# Patient Record
Sex: Female | Born: 2000 | Hispanic: Yes | Marital: Single | Smoking: Former smoker
Health system: Southern US, Community
[De-identification: ages and names within clinical notes are randomized; demographics above are authoritative.]

## PROBLEM LIST (undated history)

## (undated) DIAGNOSIS — Z789 Other specified health status: Secondary | ICD-10-CM

## (undated) HISTORY — DX: Other specified health status: Z78.9

---
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
## 2021-01-04 ENCOUNTER — Ambulatory Visit
Admission: EM | Admit: 2021-01-04 | Discharge: 2021-01-04 | Disposition: A | Discharge status: Home / Self Care | Payer: Medicaid Other | Attending: Emergency Medicine | Admitting: Emergency Medicine

## 2021-01-04 ENCOUNTER — Other Ambulatory Visit: Payer: Self-pay

## 2021-01-04 DIAGNOSIS — N3000 Acute cystitis without hematuria: Secondary | ICD-10-CM | POA: Insufficient documentation

## 2021-01-04 LAB — URINALYSIS, COMPLETE (UACMP) WITH MICROSCOPIC
Glucose, UA: NEGATIVE mg/dL
Ketones, ur: 15 mg/dL — AB
Nitrite: NEGATIVE
Protein, ur: >300 mg/dL — AB
RBC / HPF: >50 RBC/hpf (ref 0–5)
Specific Gravity, Urine: >1.03 — ABNORMAL HIGH (ref 1.005–1.030)
WBC, UA: >50 WBC/hpf (ref 0–5)
pH: 6.5 (ref 5.0–8.0)

## 2021-01-04 MED ORDER — NITROFURANTOIN MONOHYD MACRO 100 MG PO CAPS: 100.0000 mg | ORAL_CAPSULE | Freq: Two times a day (BID) | ORAL | 0 refills | Status: DC

## 2021-01-04 NOTE — Discharge Instructions (Addendum)
Your urinalysis today was positive for Shunsuke Granzow blood cells as well as bacteria which is indicative of a urinary infection, your urine sample has been sent to the lab to determine exactly which bacteria is growing so that we can make sure you are on an effective treatment, if any changes need to be made to your medication she will be called  Take nitrofurantoin twice a day for the next 5 days  At any point if your symptoms persist or worsen you may follow-up in urgent care for reevaluation  You may attempt methods in the future to prevent urinary infection such as increasing your water intake, practicing good hygiene and urinating after all sexual encounters
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
## 2021-01-04 NOTE — ED Provider Notes (Signed)
[facility]    CSN: [id number] Arrival date & time: 01/04/21  0903      History   Chief Complaint Chief Complaint  Patient presents with   Dysuria    HPI Kendra Chen is a 20 y.o. female.   Patient presents with dysuria, urinary frequency and urgency and vaginal spotting for 3 days.  Has not attempted treatment.  No pertinent medical history.  Denies vaginal discharge, itching, irritation, abdominal pain, flank pain, fever, chills.  History reviewed. No pertinent past medical history.  There are no problems to display for this patient.   History reviewed. No pertinent surgical history.  OB History   No obstetric history on file.      Home Medications    Prior to Admission medications   Medication Sig Start Date End Date Taking? Authorizing Provider  ketorolac (TORADOL) 10 MG tablet Take 1 tablet (10 mg total) by mouth every 6 (six) hours as needed for moderate pain or severe pain. 10/01/19   Coral Spikes, DO  lidocaine (XYLOCAINE) 2 % solution Gargle 15 mL every 3 hours as needed. May swallow if desired. 10/01/19   Coral Spikes, DO    Family History Family History  Problem Relation Age of Onset   Healthy Mother    Healthy Father     Social History Social History   Tobacco Use   Smoking status: Former    Types: Cigarettes    Quit date: 12/21/2020    Years since quitting: 0.0   Smokeless tobacco: Never  Substance Use Topics   Alcohol use: Never     Allergies   Patient has no known allergies.   Review of Systems Review of Systems  Constitutional: Negative.   Gastrointestinal: Negative.   Genitourinary:  Positive for dysuria, frequency and vaginal bleeding. Negative for decreased urine volume, difficulty urinating, dyspareunia, enuresis, flank pain, genital sores, hematuria, menstrual problem, pelvic pain, urgency, vaginal discharge and vaginal pain.  Skin: Negative.     Physical Exam Triage Vital Signs ED Triage Vitals  Enc  Vitals Group     BP 01/04/21 1003 114/84     Pulse Rate 01/04/21 1003 89     Resp 01/04/21 1003 16     Temp 01/04/21 1003 98.2 F (36.8 C)     Temp Source 01/04/21 1003 Oral     SpO2 01/04/21 1003 100 %     Weight --      Height --      Head Circumference --      Peak Flow --      Pain Score 01/04/21 1004 8     Pain Loc --      Pain Edu? --      Excl. in [place]? --    No data found.  Updated Vital Signs BP 114/84 (BP Location: Left Arm)   Pulse 89   Temp 98.2 F (36.8 C) (Oral)   Resp 16   LMP 12/26/2020   SpO2 100%   Visual Acuity Right Eye Distance:   Left Eye Distance:   Bilateral Distance:    Right Eye Near:   Left Eye Near:    Bilateral Near:     Physical Exam Constitutional:      Appearance: Normal appearance. She is normal weight.  HENT:     Head: Normocephalic.  Eyes:     Extraocular Movements: Extraocular movements intact.  Cardiovascular:     Rate and Rhythm: Normal rate and regular rhythm.  Pulses: Normal pulses.     Heart sounds: Normal heart sounds.  Pulmonary:     Effort: Pulmonary effort is normal.     Breath sounds: Normal breath sounds.  Abdominal:     General: Abdomen is flat. Bowel sounds are normal.     Palpations: Abdomen is soft.     Tenderness: There is no abdominal tenderness. There is no right CVA tenderness or left CVA tenderness.  Skin:    General: Skin is warm and dry.  Neurological:     Mental Status: She is alert and oriented to person, place, and time. Mental status is at baseline.  Psychiatric:        Mood and Affect: Mood normal.        Behavior: Behavior normal.     UC Treatments / Results  Labs (all labs ordered are listed, but only abnormal results are displayed) Labs Reviewed  URINALYSIS, COMPLETE (UACMP) WITH MICROSCOPIC - Abnormal; Notable for the following components:      Result Value   APPearance CLOUDY (*)    Specific Gravity, Urine >1.030 (*)    Hgb urine dipstick LARGE (*)    Bilirubin Urine SMALL  (*)    Ketones, ur 15 (*)    Protein, ur >300 (*)    Leukocytes,Ua SMALL (*)    Bacteria, UA MANY (*)    All other components within normal limits  URINE CULTURE    EKG   Radiology No results found.  Procedures Procedures (including critical care time)  Medications Ordered in UC Medications - No data to display  Initial Impression / Assessment and Plan / UC Course  I have reviewed the triage vital signs and the nursing notes.  Pertinent labs & imaging results that were available during my care of the patient were reviewed by me and considered in my medical decision making (see chart for details).  Acute cystitis without hematuria  1.  Urinalysis positive for leukocytes and bacteria, sent for culture 2.  Nitrofurantoin 100 mg twice daily for 5 days 3.  Advised increasing water intake and good vaginal hygiene 4.  Urgent care follow-up as needed for persistent or reoccurring symptoms Final Clinical Impressions(s) / UC Diagnoses   Final diagnoses:  None   Discharge Instructions   None    ED Prescriptions   None    PDMP not reviewed this encounter.   Valinda Hoar, NP 01/04/21 1057

## 2021-01-05 LAB — URINE CULTURE: Culture: <10000 — AB

## 2021-03-12 ENCOUNTER — Emergency Department: Payer: No Typology Code available for payment source

## 2021-03-12 ENCOUNTER — Other Ambulatory Visit: Payer: Self-pay

## 2021-03-12 ENCOUNTER — Encounter: Payer: Self-pay | Admitting: Emergency Medicine

## 2021-03-12 ENCOUNTER — Emergency Department
Admission: EM | Admit: 2021-03-12 | Discharge: 2021-03-12 | Disposition: A | Discharge status: Home / Self Care | Payer: No Typology Code available for payment source | Attending: Emergency Medicine | Admitting: Emergency Medicine

## 2021-03-12 DIAGNOSIS — M25531 Pain in right wrist: Secondary | ICD-10-CM

## 2021-03-12 DIAGNOSIS — Y92410 Unspecified street and highway as the place of occurrence of the external cause: Secondary | ICD-10-CM | POA: Diagnosis not present
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
## 2021-03-12 NOTE — Discharge Instructions (Addendum)
-  Follow-up with the orthopedist listed above if pain fails to improve. -Return to the emergency department anytime if you begin to experience any new or worsening symptoms
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
## 2021-03-24 ENCOUNTER — Ambulatory Visit
Admission: EM | Admit: 2021-03-24 | Discharge: 2021-03-24 | Disposition: A | Discharge status: Home / Self Care | Payer: Medicaid Other

## 2021-03-24 ENCOUNTER — Other Ambulatory Visit: Payer: Self-pay

## 2021-03-24 DIAGNOSIS — M25531 Pain in right wrist: Secondary | ICD-10-CM | POA: Diagnosis not present
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
## 2021-03-24 NOTE — Discharge Instructions (Addendum)
In about 3 to 5 days take your splint off begin to have range of motion to the hand slightly.  If symptoms continue and pain persist call [facility] and set up an orthopedic appointment You will need to follow-up with orthopedic for restrictions for work.  Take Tylenol and Motrin as needed for pain
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
## 2021-04-04 ENCOUNTER — Ambulatory Visit
Admission: EM | Admit: 2021-04-04 | Discharge: 2021-04-04 | Disposition: A | Discharge status: Home / Self Care | Payer: Medicaid Other | Attending: Emergency Medicine | Admitting: Emergency Medicine

## 2021-04-04 ENCOUNTER — Other Ambulatory Visit: Payer: Self-pay

## 2021-04-04 DIAGNOSIS — N390 Urinary tract infection, site not specified: Secondary | ICD-10-CM | POA: Diagnosis not present

## 2021-04-04 LAB — URINALYSIS, ROUTINE W REFLEX MICROSCOPIC
Bilirubin Urine: NEGATIVE
Glucose, UA: NEGATIVE mg/dL
Ketones, ur: NEGATIVE mg/dL
Nitrite: NEGATIVE
Protein, ur: NEGATIVE mg/dL
Specific Gravity, Urine: 1.015 (ref 1.005–1.030)
pH: 7 (ref 5.0–8.0)

## 2021-04-04 LAB — URINALYSIS, MICROSCOPIC (REFLEX): Bacteria, UA: NONE SEEN

## 2021-04-04 MED ORDER — NITROFURANTOIN MONOHYD MACRO 100 MG PO CAPS: 100.0000 mg | ORAL_CAPSULE | Freq: Two times a day (BID) | ORAL | 0 refills | Status: DC

## 2021-04-04 MED ORDER — PHENAZOPYRIDINE HCL 200 MG PO TABS: 200.0000 mg | ORAL_TABLET | Freq: Three times a day (TID) | ORAL | 0 refills | Status: DC

## 2021-04-04 NOTE — ED Provider Notes (Addendum)
[facility]    CSN: [id number] Arrival date & time: 04/04/21  1421      History   Chief Complaint Chief Complaint  Patient presents with   Dysuria    HPI Kendra Chen is a 21 y.o. female.   HPI  21 old female here for evaluation of urinary complaints.  Patient reports that she began having pain with urination as well as urinary frequency yesterday.  She denies any urgency.  She also denies blood in urine, fever, abdominal pain, back pain, vaginal discharge, or vaginal itching.  She is not concerned about possible STIs.  History reviewed. No pertinent past medical history.  There are no problems to display for this patient.   History reviewed. No pertinent surgical history.  OB History   No obstetric history on file.      Home Medications    Prior to Admission medications   Medication Sig Start Date End Date Taking? Authorizing Provider  nitrofurantoin, macrocrystal-monohydrate, (MACROBID) 100 MG capsule Take 1 capsule (100 mg total) by mouth 2 (two) times daily. 04/04/21  Yes Becky Augusta, NP  phenazopyridine (PYRIDIUM) 200 MG tablet Take 1 tablet (200 mg total) by mouth 3 (three) times daily. 04/04/21  Yes Becky Augusta, NP    Family History Family History  Problem Relation Age of Onset   Healthy Mother    Healthy Father     Social History Social History   Tobacco Use   Smoking status: Former    Types: Cigarettes    Quit date: 12/21/2020    Years since quitting: 0.2   Smokeless tobacco: Never  Vaping Use   Vaping Use: Every day  Substance Use Topics   Alcohol use: Never     Allergies   Patient has no known allergies.   Review of Systems Review of Systems  Constitutional:  Negative for fever.  Gastrointestinal:  Negative for abdominal pain.  Genitourinary:  Positive for dysuria and frequency. Negative for urgency, vaginal discharge and vaginal pain.  Musculoskeletal:  Negative for back pain.  Hematological: Negative.      Physical Exam Triage Vital Signs ED Triage Vitals  Enc Vitals Group     BP 04/04/21 1431 114/85     Pulse Rate 04/04/21 1431 85     Resp 04/04/21 1431 16     Temp 04/04/21 1431 98.4 F (36.9 C)     Temp Source 04/04/21 1431 Oral     SpO2 04/04/21 1431 98 %     Weight 04/04/21 1431 110 lb (49.9 kg)     Height 04/04/21 1431 5\' 2"  (1.575 m)     Head Circumference --      Peak Flow --      Pain Score 04/04/21 1434 0     Pain Loc --      Pain Edu? --      Excl. in GC? --    No data found.  Updated Vital Signs BP 114/85 (BP Location: Left Arm)    Pulse 85    Temp 98.4 F (36.9 C) (Oral)    Resp 16    Ht 5\' 2"  (1.575 m)    Wt 110 lb (49.9 kg)    LMP 03/24/2021    SpO2 98%    BMI 20.12 kg/m   Visual Acuity Right Eye Distance:   Left Eye Distance:   Bilateral Distance:    Right Eye Near:   Left Eye Near:    Bilateral Near:     Physical Exam  Vitals and nursing note reviewed.  Constitutional:      Appearance: Normal appearance. She is not ill-appearing.  HENT:     Head: Normocephalic and atraumatic.  Cardiovascular:     Rate and Rhythm: Normal rate and regular rhythm.     Pulses: Normal pulses.     Heart sounds: Normal heart sounds. No murmur heard.   No friction rub. No gallop.  Pulmonary:     Effort: Pulmonary effort is normal.     Breath sounds: Normal breath sounds. No wheezing, rhonchi or rales.  Abdominal:     General: Abdomen is flat.     Palpations: Abdomen is soft.     Tenderness: There is no abdominal tenderness. There is no right CVA tenderness, left CVA tenderness, guarding or rebound.  Skin:    General: Skin is warm and dry.     Capillary Refill: Capillary refill takes less than 2 seconds.     Findings: No erythema or rash.  Neurological:     General: No focal deficit present.     Mental Status: She is alert and oriented to person, place, and time.  Psychiatric:        Mood and Affect: Mood normal.        Behavior: Behavior normal.         Thought Content: Thought content normal.        Judgment: Judgment normal.     UC Treatments / Results  Labs (all labs ordered are listed, but only abnormal results are displayed) Labs Reviewed  URINALYSIS, ROUTINE W REFLEX MICROSCOPIC - Abnormal; Notable for the following components:      Result Value   Hgb urine dipstick TRACE (*)    Leukocytes,Ua SMALL (*)    All other components within normal limits  URINE CULTURE  URINALYSIS, MICROSCOPIC (REFLEX)    EKG   Radiology No results found.  Procedures Procedures (including critical care time)  Medications Ordered in UC Medications - No data to display  Initial Impression / Assessment and Plan / UC Course  I have reviewed the triage vital signs and the nursing notes.  Pertinent labs & imaging results that were available during my care of the patient were reviewed by me and considered in my medical decision making (see chart for details).  Patient is a nontoxic-appearing 21 year old female here for evaluation of painful urination and urinary frequency that started yesterday.  There are no other associated symptoms.  Physical exam reveals benign cardiopulmonary exam clear lung sounds in all fields.  No CVA tenderness on exam.  Abdomen is flat, soft, and nontender.  Urinalysis was collected at triage and is pending.  Urinalysis shows trace hemoglobin and small leukocytes.  Micro ReFlex 11-20 WBCs.  We will send urine for culture.  We will discharge patient home on Macrobid twice daily for 5 days for treatment of UTI as well as Pyridium to help with urinary discomfort.  I have instructed her to increase oral fluid intake so she increases urine production and flexes her urinary tract.  Return precautions reviewed with patient.   Final Clinical Impressions(s) / UC Diagnoses   Final diagnoses:  Lower urinary tract infectious disease     Discharge Instructions      Take the Macrobid twice daily for 5 days with food for  treatment of urinary tract infection.  Use the Pyridium every 8 hours as needed for urinary discomfort.  This will turn your urine a bright red-orange.  Increase your oral fluid intake so  that you increase your urine production and or flushing your urinary system.  Take an over-the-counter probiotic, such as Culturelle-Align-Activia, 1 hour after each dose of antibiotic to prevent diarrhea or yeast infections from forming.  We will culture urine and change the antibiotics if necessary.  Return for reevaluation, or see your primary care provider, for any new or worsening symptoms.      ED Prescriptions     Medication Sig Dispense Auth. Provider   nitrofurantoin, macrocrystal-monohydrate, (MACROBID) 100 MG capsule Take 1 capsule (100 mg total) by mouth 2 (two) times daily. 10 capsule Becky Augusta, NP   phenazopyridine (PYRIDIUM) 200 MG tablet Take 1 tablet (200 mg total) by mouth 3 (three) times daily. 6 tablet Becky Augusta, NP      PDMP not reviewed this encounter.   Becky Augusta, NP 04/04/21 1459    Becky Augusta, NP 04/04/21 1459

## 2021-04-04 NOTE — Discharge Instructions (Addendum)

## 2021-04-04 NOTE — ED Triage Notes (Signed)
Pt here with C/O urine burning and frequency since yesterday. Denies discharge or STD.

## 2021-04-06 LAB — URINE CULTURE: Culture: 30000 — AB

## 2021-04-15 ENCOUNTER — Other Ambulatory Visit: Payer: Self-pay

## 2021-04-15 ENCOUNTER — Encounter: Payer: Self-pay | Admitting: Emergency Medicine

## 2021-04-15 ENCOUNTER — Ambulatory Visit
Admission: EM | Admit: 2021-04-15 | Discharge: 2021-04-15 | Disposition: A | Discharge status: Home / Self Care | Payer: Medicaid Other

## 2021-04-15 DIAGNOSIS — L299 Pruritus, unspecified: Secondary | ICD-10-CM | POA: Diagnosis not present

## 2021-04-15 DIAGNOSIS — L509 Urticaria, unspecified: Secondary | ICD-10-CM

## 2021-04-15 NOTE — Discharge Instructions (Addendum)
-  Your hives and itching are likely due to the new laundry detergent.  I would not use that anymore and rewash any of her close and sheets that you had washed again with the old detergent ACS. ?- Start taking nondrowsy Claritin during the day and 2 Benadryl at night or 1-2 Benadryl every 6 hours as needed for itching.  You can use hydrocortisone cream on the areas that are most itchy. ?- If this is not improving and resolving in the next few days, make a follow-up appoint with your PCP as you may need allergy testing. ?

## 2021-04-15 NOTE — ED Provider Notes (Signed)
?[facility] ? ? ? ?CSN: [id number] ?Arrival date & time: 04/15/21  1140 ? ? ?  ? ?History   ?Chief Complaint ?Chief Complaint  ?Patient presents with  ? Pruritis  ? ? ?HPI ?Kendra Chen is a 21 y.o. female presenting for 2 day history of red, itchy rash of torso.  Reports her entire body itches though.  She states she started using a new detergent few days ago.  She states she washed her bed sheets with a new detergent and when she woke up in the morning she started itching everywhere.  Patient also reports that she cooked a meal with coconut oil and is not cooked with coconut oil before.  She says she has had coconut water and other coconut things without any reactions.  She says she actually does not know of any allergies that she has at all.  Patient denies any facial swelling or breathing trouble.  She has not treated condition in any way.  She says she did rewash her sheets and has not been wearing any close but she had washed and new detergent.  She has no other concerns. ? ?HPI ? ?History reviewed. No pertinent past medical history. ? ?There are no problems to display for this patient. ? ? ?History reviewed. No pertinent surgical history. ? ?OB History   ?No obstetric history on file. ?  ? ? ? ?Home Medications   ? ?Prior to Admission medications   ?Not on File  ? ? ?Family History ?Family History  ?Problem Relation Age of Onset  ? Healthy Mother   ? Healthy Father   ? ? ?Social History ?Social History  ? ?Tobacco Use  ? Smoking status: Former  ?  Types: Cigarettes  ?  Quit date: 12/21/2020  ?  Years since quitting: 0.3  ? Smokeless tobacco: Never  ?Vaping Use  ? Vaping Use: Former  ?Substance Use Topics  ? Alcohol use: Never  ? Drug use: Not Currently  ? ? ? ?Allergies   ?Patient has no known allergies. ? ? ?Review of Systems ?Review of Systems  ?Constitutional:  Negative for fatigue and fever.  ?HENT:  Negative for facial swelling and trouble swallowing.   ?Respiratory:  Negative for chest  tightness, shortness of breath and wheezing.   ?Gastrointestinal:  Negative for vomiting.  ?Skin:  Positive for rash.  ?Neurological:  Negative for weakness and numbness.  ? ? ?Physical Exam ?Triage Vital Signs ?ED Triage Vitals  ?Enc Vitals Group  ?   BP 04/15/21 1208 108/72  ?   Pulse Rate 04/15/21 1208 67  ?   Resp 04/15/21 1208 18  ?   Temp 04/15/21 1208 98.7 ?F (37.1 ?C)  ?   Temp Source 04/15/21 1208 Oral  ?   SpO2 04/15/21 1208 99 %  ?   Weight 04/15/21 1207 110 lb 0.2 oz (49.9 kg)  ?   Height 04/15/21 1207 5\' 2"  (1.575 m)  ?   Head Circumference --   ?   Peak Flow --   ?   Pain Score 04/15/21 1206 0  ?   Pain Loc --   ?   Pain Edu? --   ?   Excl. in GC? --   ? ?No data found. ? ?Updated Vital Signs ?BP 108/72 (BP Location: Right Arm)   Pulse 67   Temp 98.7 ?F (37.1 ?C) (Oral)   Resp 18   Ht 5\' 2"  (1.575 m)   Wt 110 lb 0.2 oz (49.9  kg)   LMP 03/24/2021 (Approximate)   SpO2 99%   BMI 20.12 kg/m?  ?   ? ?Physical Exam ?Vitals and nursing note reviewed.  ?Constitutional:   ?   General: She is not in acute distress. ?   Appearance: Normal appearance. She is not ill-appearing or toxic-appearing.  ?HENT:  ?   Head: Normocephalic and atraumatic.  ?   Nose: Nose normal.  ?   Mouth/Throat:  ?   Mouth: Mucous membranes are moist.  ?   Pharynx: Oropharynx is clear.  ?Eyes:  ?   General: No scleral icterus.    ?   Right eye: No discharge.     ?   Left eye: No discharge.  ?   Conjunctiva/sclera: Conjunctivae normal.  ?Cardiovascular:  ?   Rate and Rhythm: Normal rate and regular rhythm.  ?   Heart sounds: Normal heart sounds.  ?Pulmonary:  ?   Effort: Pulmonary effort is normal. No respiratory distress.  ?   Breath sounds: Normal breath sounds.  ?Musculoskeletal:  ?   Cervical back: Neck supple.  ?Skin: ?   General: Skin is dry.  ?   Findings: Rash (few minor areas of erythematous wheals abdomen and back) present.  ?Neurological:  ?   General: No focal deficit present.  ?   Mental Status: She is alert. Mental  status is at baseline.  ?   Motor: No weakness.  ?   Gait: Gait normal.  ?Psychiatric:     ?   Mood and Affect: Mood normal.     ?   Behavior: Behavior normal.     ?   Thought Content: Thought content normal.  ? ? ? ?UC Treatments / Results  ?Labs ?(all labs ordered are listed, but only abnormal results are displayed) ?Labs Reviewed - No data to display ? ?EKG ? ? ?Radiology ?No results found. ? ?Procedures ?Procedures (including critical care time) ? ?Medications Ordered in UC ?Medications - No data to display ? ?Initial Impression / Assessment and Plan / UC Course  ?I have reviewed the triage vital signs and the nursing notes. ? ?Pertinent labs & imaging results that were available during my care of the patient were reviewed by me and considered in my medical decision making (see chart for details). ? ?21 year old female presenting for urticarial pruritic rash of abdomen.  Onset was immediately after she started washing with a new detergent.  Because reports the use of coconut oil.  Patient has a very minor rash of his abdomen and back.  Has not taken any antihistamines.  Advised patient symptoms most likely due to changing the detergent and go back to the alone.  Reviewed starting antihistamines and using topical hydrocortisone cream as needed for itching.  Reviewed if no improvement after making these changes may need to follow-up PCP for allergy testing.  ED precautions reviewed. ? ? ?Final Clinical Impressions(s) / UC Diagnoses  ? ?Final diagnoses:  ?Hives  ?Pruritus  ? ? ? ?Discharge Instructions   ? ?  ?-Your hives and itching are likely due to the new laundry detergent.  I would not use that anymore and rewash any of her close and sheets that you had washed again with the old detergent ACS. ?- Start taking nondrowsy Claritin during the day and 2 Benadryl at night or 1-2 Benadryl every 6 hours as needed for itching.  You can use hydrocortisone cream on the areas that are most itchy. ?- If this is not  improving and  resolving in the next few days, make a follow-up appoint with your PCP as you may need allergy testing. ? ? ? ? ?ED Prescriptions   ?None ?  ? ?PDMP not reviewed this encounter. ?  ?Shirlee Latch, PA-C ?04/15/21 1310 ? ?

## 2021-04-15 NOTE — ED Triage Notes (Signed)
Pt c/o itching over her entire body. Started about 2 days ago. No visible rash, she does have marks on her skin from scratching with her finger nails. She states she changes her fabric softener recently and believes that it what is causing the itching. She states she has also started cooking with coconut oil. No changes in medications, or soaps. ?

## 2021-07-25 ENCOUNTER — Other Ambulatory Visit: Payer: Self-pay

## 2021-07-25 ENCOUNTER — Emergency Department
Admission: EM | Admit: 2021-07-25 | Discharge: 2021-07-25 | Disposition: A | Discharge status: Home / Self Care | Payer: Medicaid Other | Attending: Emergency Medicine | Admitting: Emergency Medicine

## 2021-07-25 ENCOUNTER — Emergency Department: Payer: Medicaid Other

## 2021-07-25 DIAGNOSIS — N9489 Other specified conditions associated with female genital organs and menstrual cycle: Secondary | ICD-10-CM | POA: Insufficient documentation

## 2021-07-25 DIAGNOSIS — N898 Other specified noninflammatory disorders of vagina: Secondary | ICD-10-CM | POA: Insufficient documentation

## 2021-07-25 DIAGNOSIS — Z3492 Encounter for supervision of normal pregnancy, unspecified, second trimester: Secondary | ICD-10-CM

## 2021-07-25 DIAGNOSIS — O26892 Other specified pregnancy related conditions, second trimester: Secondary | ICD-10-CM

## 2021-07-25 DIAGNOSIS — O26852 Spotting complicating pregnancy, second trimester: Secondary | ICD-10-CM | POA: Diagnosis not present

## 2021-07-25 DIAGNOSIS — Z3A17 17 weeks gestation of pregnancy: Secondary | ICD-10-CM | POA: Diagnosis not present

## 2021-07-25 DIAGNOSIS — R55 Syncope and collapse: Secondary | ICD-10-CM | POA: Diagnosis not present

## 2021-07-25 LAB — WET PREP, GENITAL
Clue Cells Wet Prep HPF POC: NONE SEEN
Sperm: NONE SEEN
Trich, Wet Prep: NONE SEEN
WBC, Wet Prep HPF POC: >=10 — AB (ref <10)
Yeast Wet Prep HPF POC: NONE SEEN

## 2021-07-25 LAB — URINALYSIS, ROUTINE W REFLEX MICROSCOPIC
Bacteria, UA: NONE SEEN
Bilirubin Urine: NEGATIVE
Glucose, UA: NEGATIVE mg/dL
Hgb urine dipstick: NEGATIVE
Ketones, ur: NEGATIVE mg/dL
Nitrite: NEGATIVE
Protein, ur: NEGATIVE mg/dL
Specific Gravity, Urine: 1.02 (ref 1.005–1.030)
pH: 7 (ref 5.0–8.0)

## 2021-07-25 LAB — BASIC METABOLIC PANEL
Anion gap: 6 (ref 5–15)
BUN: 11 mg/dL (ref 6–20)
CO2: 24 mmol/L (ref 22–32)
Calcium: 9.1 mg/dL (ref 8.9–10.3)
Chloride: 106 mmol/L (ref 98–111)
Creatinine, Ser: 0.31 mg/dL — ABNORMAL LOW (ref 0.44–1.00)
GFR, Estimated: >60 mL/min (ref >60)
Glucose, Bld: 82 mg/dL (ref 70–99)
Potassium: 3.9 mmol/L (ref 3.5–5.1)
Sodium: 136 mmol/L (ref 135–145)

## 2021-07-25 LAB — CBC WITH DIFFERENTIAL/PLATELET
Abs Immature Granulocytes: 0.04 10*3/uL (ref 0.00–0.07)
Basophils Absolute: 0 10*3/uL (ref 0.0–0.1)
Basophils Relative: 0 %
Eosinophils Absolute: 0 10*3/uL (ref 0.0–0.5)
Eosinophils Relative: 0 %
HCT: 34 % — ABNORMAL LOW (ref 36.0–46.0)
Hemoglobin: 11.2 g/dL — ABNORMAL LOW (ref 12.0–15.0)
Immature Granulocytes: 1 %
Lymphocytes Relative: 23 %
Lymphs Abs: 1.9 10*3/uL (ref 0.7–4.0)
MCH: 28.3 pg (ref 26.0–34.0)
MCHC: 32.9 g/dL (ref 30.0–36.0)
MCV: 85.9 fL (ref 80.0–100.0)
Monocytes Absolute: 0.4 10*3/uL (ref 0.1–1.0)
Monocytes Relative: 5 %
Neutro Abs: 5.9 10*3/uL (ref 1.7–7.7)
Neutrophils Relative %: 71 %
Platelets: 150 10*3/uL (ref 150–400)
RBC: 3.96 MIL/uL (ref 3.87–5.11)
RDW: 12.8 % (ref 11.5–15.5)
WBC: 8.3 10*3/uL (ref 4.0–10.5)
nRBC: 0 % (ref 0.0–0.2)

## 2021-07-25 LAB — POC URINE PREG, ED: Preg Test, Ur: POSITIVE — AB

## 2021-07-25 LAB — CHLAMYDIA/NGC RT PCR (ARMC ONLY)
Chlamydia Tr: NOT DETECTED
N gonorrhoeae: NOT DETECTED

## 2021-07-25 LAB — HCG, QUANTITATIVE, PREGNANCY: hCG, Beta Chain, Quant, S: 27438 m[IU]/mL — ABNORMAL HIGH (ref <5)

## 2021-07-25 NOTE — ED Provider Triage Note (Signed)
Emergency Medicine Provider Triage Evaluation Note  [facility] , a 21 y.o. female  was evaluated in triage.  Pt complains of vaginal discharge and pelvic cramping. She is [redacted] weeks pregnant. No vaginal bleeding.    Physical Exam  BP 120/79 (BP Location: Left Arm)   Pulse 87   Temp 98.5 F (36.9 C) (Oral)   Resp 18   SpO2 96%  Gen:   Awake, no distress   Resp:  Normal effort  MSK:   Moves extremities without difficulty  Other:    Medical Decision Making  Medically screening exam initiated at 6:00 PM.  Appropriate orders placed.  Kendra Chen was informed that the remainder of the evaluation will be completed by another provider, this initial triage assessment does not replace that evaluation, and the importance of remaining in the ED until their evaluation is complete.    Chinita Pester, FNP 07/25/21 2242

## 2021-07-25 NOTE — ED Provider Notes (Addendum)
[facility] Provider Note    Event Date/Time   First MD Initiated Contact with Patient 07/25/21 1825     (approximate)   History   Vaginal Discharge   HPI  Kendra Chen is a 21 y.o. female presents to the emergency department for treatment and evaluation of vaginal discharge and pelvic cramping.  No bleeding.  She is approximately [redacted] weeks pregnant.  Gravida 1.  She has had her initial prenatal visit with [facility] department.  History reviewed. No pertinent past medical history.   Physical Exam   Triage Vital Signs: ED Triage Vitals  Enc Vitals Group     BP 07/25/21 1759 120/79     Pulse Rate 07/25/21 1759 87     Resp 07/25/21 1759 18     Temp 07/25/21 1759 98.5 F (36.9 C)     Temp Source 07/25/21 1759 Oral     SpO2 07/25/21 1759 96 %     Weight 07/25/21 1800 110 lb (49.9 kg)     Height 07/25/21 1800 5\' 2"  (1.575 m)     Head Circumference --      Peak Flow --      Pain Score 07/25/21 1759 3     Pain Loc --      Pain Edu? --      Excl. in GC? --     Most recent vital signs: Vitals:   07/25/21 1759  BP: 120/79  Pulse: 87  Resp: 18  Temp: 98.5 F (36.9 C)  SpO2: 96%    General: Awake, no distress.  CV:  Good peripheral perfusion.  Resp:  Normal effort.  Abd:  No distention.  Other:  Cervix is closed.  No blood in the vaginal vault.  Thick yellowish discharge on cervix.   ED Results / Procedures / Treatments   Labs (all labs ordered are listed, but only abnormal results are displayed) Labs Reviewed  WET PREP, GENITAL - Abnormal; Notable for the following components:      Result Value   WBC, Wet Prep HPF POC >=10 (*)    All other components within normal limits  URINALYSIS, ROUTINE W REFLEX MICROSCOPIC - Abnormal; Notable for the following components:   Color, Urine YELLOW (*)    APPearance CLEAR (*)    Leukocytes,Ua TRACE (*)    All other components within normal limits  HCG, QUANTITATIVE, PREGNANCY -  Abnormal; Notable for the following components:   hCG, Beta Chain, Quant, S M (*)    All other components within normal limits  CBC WITH DIFFERENTIAL/PLATELET - Abnormal; Notable for the following components:   Hemoglobin 11.2 (*)    HCT 34.0 (*)    All other components within normal limits  BASIC METABOLIC PANEL - Abnormal; Notable for the following components:   Creatinine, Ser 0.31 (*)    All other components within normal limits  POC URINE PREG, ED - Abnormal; Notable for the following components:   Preg Test, Ur POSITIVE (*)    All other components within normal limits  CHLAMYDIA/NGC RT PCR ([facility] ONLY)               EKG     RADIOLOGY  Single IUP fetal heart rate 147  I have independently reviewed and interpreted imaging as well as reviewed report from radiology.  PROCEDURES:  Critical Care performed: No  Procedures  Pelvic exam and specimen collection.  MEDICATIONS ORDERED IN ED:  Medications - No data to display  IMPRESSION / MDM / ASSESSMENT AND PLAN / ED COURSE   I reviewed the triage vital signs and the nursing notes.  Differential diagnosis includes, but is not limited to: Vaginal discharge, chlamydia, gonorrhea, trichomoniasis.  Patient's presentation is most consistent with acute presentation with potential threat to life or bodily function.  21 year old female presenting to the emergency department for treatment and evaluation of pelvic cramping and vaginal discharge in the setting of second trimester pregnancy.  See HPI for further details.  Pelvic exam completed and specimens obtained.  Awaiting results of labs and ultrasound. Clinical Course as of 07/25/21 2132  Wynelle Link Jul 25, 2021  1925 Patient now reporting that she has had 2 syncopal episodes this week. One being Monday while at work. Someone caught her before she hit her head. And the second was Wednesday, but she felt like she was going to pass out and sat down to prevent injury. She  reported these events to her clinic who felt it was likely glucose or iron level related. They also scheduled her for an EKG this week. She states that she has passed out a few other times in the past, but has never been evaluated. No dizziness or symptoms at present.  [CT]  2048 EKG is normal. Labs are reassuring. Wet  prep specimen shows WBC. GC and chlamydia are pending. US shows single IUP 17 weeks 3 days with FHR of 147. [CT]  2131 Chlamydia and gonorrhea testing is negative.  Patient will be discharged home and encouraged to keep her follow-up appointments with the clinic.  She was encouraged to return to the emergency department for any symptom changes or worsens or she is unable to get an appointment right away. [CT]    Clinical Course User Index [CT] Terena Bohan B, FNP     FINAL CLINICAL IMPRESSION(S) / ED DIAGNOSES   Final diagnoses:  Vaginal discharge  Second trimester pregnancy  Syncope, unspecified syncope type     Rx / DC Orders   ED Discharge Orders     None        Note:  This document was prepared using Dragon voice recognition software and may include unintentional dictation errors.   Chinita Pester, FNP 07/25/21 2132    Chinita Pester, FNP 07/25/21 2149    Merwyn Katos, MD 07/25/21 2320

## 2021-07-25 NOTE — ED Notes (Signed)
Pt presents to ED with c/o of vaginal discharge. Pt states the discharge has been going on for about 1 week. Pt describes discharge as think and white. Denies any odor. Pt also states she had an syncopal episode on Monday as well. Pt states she was out for less than a minute. Pt denies hitting her head.

## 2021-07-25 NOTE — Discharge Instructions (Signed)
Your STD testing is negative today.  Your labs do not show any concerning findings.  Your EKG is normal.  Please keep your follow-up appointment as scheduled.  Return to the emergency department for symptoms of change or worsen if you are unable to schedule an appointment.

## 2021-07-25 NOTE — ED Triage Notes (Signed)
Pt states for the past week she has had yellowish green discharge and cramping- pt is 17 weeks preg with her first pregnancy- pt states she gets her prenatal care in [place]

## 2021-07-25 NOTE — ED Notes (Signed)
dc ppw provided to patient. Followup and rx information given. pt declines vs at this time and provides verbal consent for dc at this time. 

## 2021-07-25 NOTE — ED Notes (Signed)
Pt urine sent to lab.

## 2021-08-10 ENCOUNTER — Ambulatory Visit
Admission: EM | Admit: 2021-08-10 | Discharge: 2021-08-10 | Disposition: A | Discharge status: Home / Self Care | Payer: Medicaid Other | Attending: Physician Assistant | Admitting: Physician Assistant

## 2021-08-10 DIAGNOSIS — Z3A20 20 weeks gestation of pregnancy: Secondary | ICD-10-CM | POA: Insufficient documentation

## 2021-08-10 DIAGNOSIS — O2340 Unspecified infection of urinary tract in pregnancy, unspecified trimester: Secondary | ICD-10-CM | POA: Diagnosis not present

## 2021-08-10 DIAGNOSIS — O219 Vomiting of pregnancy, unspecified: Secondary | ICD-10-CM | POA: Diagnosis not present

## 2021-08-10 DIAGNOSIS — R197 Diarrhea, unspecified: Secondary | ICD-10-CM | POA: Insufficient documentation

## 2021-08-10 DIAGNOSIS — R112 Nausea with vomiting, unspecified: Secondary | ICD-10-CM | POA: Diagnosis present

## 2021-08-10 DIAGNOSIS — O99612 Diseases of the digestive system complicating pregnancy, second trimester: Secondary | ICD-10-CM | POA: Diagnosis not present

## 2021-08-10 DIAGNOSIS — R8290 Unspecified abnormal findings in urine: Secondary | ICD-10-CM | POA: Diagnosis present

## 2021-08-10 LAB — URINALYSIS, ROUTINE W REFLEX MICROSCOPIC
Bilirubin Urine: NEGATIVE
Glucose, UA: NEGATIVE mg/dL
Hgb urine dipstick: NEGATIVE
Ketones, ur: >160 mg/dL — AB
Nitrite: NEGATIVE
Protein, ur: NEGATIVE mg/dL
Specific Gravity, Urine: 1.015 (ref 1.005–1.030)
pH: 7.5 (ref 5.0–8.0)

## 2021-08-10 LAB — URINALYSIS, MICROSCOPIC (REFLEX)

## 2021-08-10 MED ORDER — ONDANSETRON 4 MG PO TBDP: 4.0000 mg | ORAL_TABLET | Freq: Four times a day (QID) | ORAL | 0 refills | Status: AC | PRN

## 2021-08-10 MED ORDER — ONDANSETRON 4 MG PO TBDP
4.0000 mg | ORAL_TABLET | Freq: Once | ORAL | Status: AC
  Administered 2021-08-10: 4 mg via ORAL

## 2021-08-10 NOTE — Discharge Instructions (Addendum)
-  You most likely have a stomach virus or food poisoning.  In both cases people generally feel better within 24 to 48 hours. - I sent something for your nausea. - Make sure to replenish your electrolytes.  I would suggest Pedialyte and continue with a bland diet. - I am culturing the urine since there were a few white blood cells in there.  We will call you in a couple days if you need antibiotics for a UTI.  If you start to become symptomatic before then, please call us and we may send something for you sooner. - If your abdominal pain worsens, you develop a fever greater than 100.4 degrees or you have worsening abdominal or back pain you should go to the emergency department for immediate evaluation.  If you notice you have any vaginal bleeding you should call 911 or go immediately to the ER.  ABDOMINAL PAIN: You may take Tylenol for pain relief. Use medications as directed including antiemetics and antidiarrheal medications if suggested or prescribed. You should increase fluids and electrolytes as well as rest over these next several days. If you have any questions or concerns, or if your symptoms are not improving or if especially if they acutely worsen, please call or stop back to the clinic immediately and we will be happy to help you or go to the ER   ABDOMINAL PAIN RED FLAGS: Seek immediate further care if: symptoms remain the same or worsen over the next 3-7 days, you are unable to keep fluids down, you see blood or mucus in your stool, you vomit black or dark red material, you have a fever of 101.F or higher, you have localized and/or persistent abdominal pain

## 2021-08-10 NOTE — ED Triage Notes (Signed)
Pt c/o diarrhea and vomiting. Pt ate spicy food and cookout on 08/09/21.  Pt ate Mayonnaise and cheese that has been left out of the fridge.  Pt is having stomach pain, back aches, and a headache.  Pt states that she has been vomiting since 3am.   Pt is 20w Pregnant.   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

## 2021-08-10 NOTE — ED Provider Notes (Signed)
[facility]    CSN: [id number] Arrival date & time: 08/10/21  1625      History   Chief Complaint Chief Complaint  Patient presents with   Abdominal Pain    HPI Kendra Chen is a 21 y.o. female presenting for onset of nausea/vomiting and diarrhea last night.  Patient reports she ate 5 corn on the cob's and had fast food for breakfast.  She also reports that she ate a lot of fast food from cookout last night.  Patient reports that she had a large amount of food last night and within an hour of having all this.  She started to have nausea and vomiting with diarrhea.  Reports today she has had 5 episodes of vomiting and about 5 episodes of watery diarrhea.  She reports that her vomit is greenish in coloration and her stool is watery.  Denies any associated blood or black stools.  She does report lower back ache and abdominal cramping.  He says her back pain is worse than her abdominal pain.  She reports that she gets abdominal cramping before she needs to have a BM.  Reports mild headaches as well.  She has not had a fever.  Has been fatigued.  States that she went to work today and did not feel well and had multiple episodes of vomiting and diarrhea at work.  Patient is [redacted] weeks pregnant and says everything has been going well with her pregnancy.  She denies any vaginal bleeding, dysuria or vaginal discharge.  Reports she had a UTI couple of months ago but it cleared up and she is no longer having UTI symptoms.  She denies any sick contacts presently.  Has not had any recent travel and no recent antibiotics in the past 1 month.  Has not been taking any OTC meds for symptoms.  No other complaints.  HPI  History reviewed. No pertinent past medical history.  There are no problems to display for this patient.   History reviewed. No pertinent surgical history.  OB History     Gravida  1   Para      Term      Preterm      AB      Living         SAB      IAB       Ectopic      Multiple      Live Births               Home Medications    Prior to Admission medications   Medication Sig Start Date End Date Taking? Authorizing Provider  ondansetron (ZOFRAN-ODT) 4 MG disintegrating tablet Take 1 tablet (4 mg total) by mouth every 6 (six) hours as needed for up to 3 days for nausea or vomiting. 08/10/21 08/13/21 Yes Shirlee Latch, PA-C    Family History Family History  Problem Relation Age of Onset   Healthy Mother    Healthy Father     Social History Social History   Tobacco Use   Smoking status: Former    Types: Cigarettes    Quit date: 12/21/2020    Years since quitting: 0.6   Smokeless tobacco: Never  Vaping Use   Vaping Use: Former  Substance Use Topics   Alcohol use: Never   Drug use: Not Currently     Allergies   Patient has no known allergies.   Review of Systems Review of Systems  Constitutional:  Positive  for appetite change. Negative for fatigue and fever.  HENT:  Negative for congestion.   Respiratory:  Negative for cough and shortness of breath.   Cardiovascular:  Negative for chest pain.  Gastrointestinal:  Positive for abdominal pain, diarrhea, nausea and vomiting. Negative for blood in stool.  Genitourinary:  Negative for dysuria, flank pain, hematuria, vaginal bleeding and vaginal discharge.  Musculoskeletal:  Positive for back pain.  Neurological:  Positive for headaches. Negative for weakness.     Physical Exam Triage Vital Signs ED Triage Vitals  Enc Vitals Group     BP 08/10/21 1719 122/82     Pulse Rate 08/10/21 1719 (!) 113     Resp 08/10/21 1719 18     Temp 08/10/21 1719 99.6 F (37.6 C)     Temp Source 08/10/21 1719 Oral     SpO2 08/10/21 1719 97 %     Weight 08/10/21 1718 110 lb (49.9 kg)     Height 08/10/21 1718 5\' 3"  (1.6 m)     Head Circumference --      Peak Flow --      Pain Score 08/10/21 1717 7     Pain Loc --      Pain Edu? --      Excl. in GC? --    No data  found.  Updated Vital Signs BP 122/82 (BP Location: Left Arm)   Pulse (!) 113   Temp 99.6 F (37.6 C) (Oral)   Resp 18   Ht 5\' 3"  (1.6 m)   Wt 110 lb (49.9 kg)   LMP 03/24/2021 (Approximate)   SpO2 97%   BMI 19.49 kg/m       Physical Exam Vitals and nursing note reviewed.  Constitutional:      General: She is not in acute distress.    Appearance: Normal appearance. She is ill-appearing. She is not toxic-appearing.  HENT:     Head: Normocephalic and atraumatic.     Nose: Nose normal.     Mouth/Throat:     Mouth: Mucous membranes are moist.     Pharynx: Oropharynx is clear.  Eyes:     General: No scleral icterus.       Right eye: No discharge.        Left eye: No discharge.     Conjunctiva/sclera: Conjunctivae normal.  Cardiovascular:     Rate and Rhythm: Regular rhythm. Tachycardia present.     Heart sounds: Normal heart sounds.  Pulmonary:     Effort: Pulmonary effort is normal. No respiratory distress.     Breath sounds: Normal breath sounds.  Abdominal:     Palpations: Abdomen is soft.     Tenderness: There is abdominal tenderness (mild suprapubic). There is no right CVA tenderness or left CVA tenderness.  Musculoskeletal:     Cervical back: Neck supple.  Skin:    General: Skin is dry.  Neurological:     General: No focal deficit present.     Mental Status: She is alert. Mental status is at baseline.     Motor: No weakness.     Gait: Gait normal.  Psychiatric:        Mood and Affect: Mood normal.        Behavior: Behavior normal.        Thought Content: Thought content normal.      UC Treatments / Results  Labs (all labs ordered are listed, but only abnormal results are displayed) Labs Reviewed  URINALYSIS, ROUTINE W REFLEX MICROSCOPIC -  Abnormal; Notable for the following components:      Result Value   APPearance HAZY (*)    Ketones, ur >160 (*)    Leukocytes,Ua TRACE (*)    All other components within normal limits  URINALYSIS, MICROSCOPIC  (REFLEX) - Abnormal; Notable for the following components:   Bacteria, UA MANY (*)    All other components within normal limits  URINE CULTURE    EKG   Radiology No results found.  Procedures Procedures (including critical care time)  Medications Ordered in UC Medications  ondansetron (ZOFRAN-ODT) disintegrating tablet 4 mg (4 mg Oral Given 08/10/21 1802)    Initial Impression / Assessment and Plan / UC Course  I have reviewed the triage vital signs and the nursing notes.  Pertinent labs & imaging results that were available during my care of the patient were reviewed by me and considered in my medical decision making (see chart for details).  21 year old female who is [redacted] weeks pregnant presents for nausea, vomiting and diarrhea since last night.  Also has had abdominal cramping and backache.  Patient reports symptom onset about an hour after she ate a large amount of cookout fast food.  She also reports eating 5 corn on the cob and having fast food earlier in the day as well.  She denies any sick contacts.  No recent travel.  Patient is mildly tachycardic but expect this may be tachycardia related to pregnancy.  She is afebrile.  She is mildly ill-appearing but nontoxic.  Abdomen is soft and she has mild suprapubic tenderness.  No CVA tenderness.  Chest clear to auscultation.  Patient given 4 mg ODT Zofran in clinic  UA today shows trace leukocytes.  We will send urine for culture and treat for UTI if positive for bacteria on culture.  Advised patient symptoms and presentation most likely related to viral gastroenteritis versus food poisoning.  Advised symptoms should get better within 24 to 48 hours.  Sent Zofran to pharmacy.  Encouraged her to increase rest and fluid intake.  Advised Pedialyte.  Advise close monitoring and if she develops a fever, has black or bloody stool or localized/worsening abdominal or back pain or if she has vaginal bleeding she needs to go immediately to the  emergency department.  Patient agrees with plan.  Work note given.    Final Clinical Impressions(s) / UC Diagnoses   Final diagnoses:  Nausea vomiting and diarrhea  Abnormal urinalysis     Discharge Instructions      -You most likely have a stomach virus or food poisoning.  In both cases people generally feel better within 24 to 48 hours. - I sent something for your nausea. - Make sure to replenish your electrolytes.  I would suggest Pedialyte and continue with a bland diet. - I am culturing the urine since there were a few white blood cells in there.  We will call you in a couple days if you need antibiotics for a UTI.  If you start to become symptomatic before then, please call us and we may send something for you sooner. - If your abdominal pain worsens, you develop a fever greater than 100.4 degrees or you have worsening abdominal or back pain you should go to the emergency department for immediate evaluation.  If you notice you have any vaginal bleeding you should call 911 or go immediately to the ER.  ABDOMINAL PAIN: You may take Tylenol for pain relief. Use medications as directed including antiemetics and antidiarrheal medications  if suggested or prescribed. You should increase fluids and electrolytes as well as rest over these next several days. If you have any questions or concerns, or if your symptoms are not improving or if especially if they acutely worsen, please call or stop back to the clinic immediately and we will be happy to help you or go to the ER   ABDOMINAL PAIN RED FLAGS: Seek immediate further care if: symptoms remain the same or worsen over the next 3-7 days, you are unable to keep fluids down, you see blood or mucus in your stool, you vomit black or dark red material, you have a fever of 101.F or higher, you have localized and/or persistent abdominal pain       ED Prescriptions     Medication Sig Dispense Auth. Provider   ondansetron (ZOFRAN-ODT) 4 MG  disintegrating tablet Take 1 tablet (4 mg total) by mouth every 6 (six) hours as needed for up to 3 days for nausea or vomiting. 12 tablet Gareth Morgan      PDMP not reviewed this encounter.   Shirlee Latch, PA-C 08/10/21 1828

## 2021-08-12 ENCOUNTER — Telehealth (HOSPITAL_COMMUNITY): Payer: Self-pay | Admitting: Emergency Medicine

## 2021-08-12 LAB — URINE CULTURE: Culture: 50000 — AB

## 2021-08-12 MED ORDER — AMOXICILLIN 500 MG PO CAPS: 500.0000 mg | ORAL_CAPSULE | Freq: Two times a day (BID) | ORAL | 0 refills | Status: AC

## 2021-12-30 HISTORY — PX: OTHER SURGICAL HISTORY: SHX169

## 2022-11-23 ENCOUNTER — Ambulatory Visit: Payer: Medicaid Other | Admitting: Family Medicine

## 2022-11-23 ENCOUNTER — Encounter: Payer: Self-pay | Admitting: Family Medicine

## 2022-11-23 VITALS — BP 105/65 | HR 82 | Temp 97.4°F | Ht 64.5 in | Wt 114.2 lb

## 2022-11-23 DIAGNOSIS — O0932 Supervision of pregnancy with insufficient antenatal care, second trimester: Secondary | ICD-10-CM | POA: Insufficient documentation

## 2022-11-23 DIAGNOSIS — Z3480 Encounter for supervision of other normal pregnancy, unspecified trimester: Secondary | ICD-10-CM | POA: Insufficient documentation

## 2022-11-23 DIAGNOSIS — O09899 Supervision of other high risk pregnancies, unspecified trimester: Secondary | ICD-10-CM | POA: Insufficient documentation

## 2022-11-23 DIAGNOSIS — Z862 Personal history of diseases of the blood and blood-forming organs and certain disorders involving the immune mechanism: Secondary | ICD-10-CM | POA: Insufficient documentation

## 2022-11-23 DIAGNOSIS — Z3482 Encounter for supervision of other normal pregnancy, second trimester: Secondary | ICD-10-CM | POA: Diagnosis not present

## 2022-11-23 DIAGNOSIS — O219 Vomiting of pregnancy, unspecified: Secondary | ICD-10-CM | POA: Insufficient documentation

## 2022-11-23 DIAGNOSIS — Z23 Encounter for immunization: Secondary | ICD-10-CM | POA: Diagnosis not present

## 2022-11-23 HISTORY — DX: Supervision of other high risk pregnancies, unspecified trimester: O09.899

## 2022-11-23 HISTORY — DX: Encounter for supervision of other normal pregnancy, unspecified trimester: Z34.80

## 2022-11-23 HISTORY — DX: Vomiting of pregnancy, unspecified: O21.9

## 2022-11-23 HISTORY — DX: Supervision of pregnancy with insufficient antenatal care, second trimester: O09.32

## 2022-11-23 LAB — WET PREP FOR TRICH, YEAST, CLUE
Trichomonas Exam: NEGATIVE
Yeast Exam: NEGATIVE

## 2022-11-23 LAB — HEMOGLOBIN, FINGERSTICK: Hemoglobin: 13.7 g/dL (ref 11.1–15.9)

## 2022-11-23 NOTE — Progress Notes (Addendum)
Presents for initiation of prenatal care and denies any prior medical care thus far in pregnancy. Reports has been having a problem with N/V.   Born in the [place] and denies any history of international travel. Desires MaterniT-21 and AFP testing today. (08/07/22 LMP exact per her record on phone). Jossie Ng, RN [facility] [place] referral with snapshot pages faxed with confirmation received. Counseled client [facility] will attempt daily x3 to notify her of [place] appt. Per client, has voicemail set up. Verified client name, address, phone # and DOB. Verified contact name / number. Jossie Ng, RN Wet prep negative and hgb = 13.7. No intervention required per standing order. Jossie Ng, RN

## 2022-11-23 NOTE — Progress Notes (Addendum)
[facility]  Maternal Health Clinic   INITIAL PRENATAL VISIT NOTE  Subjective:  Kendra Chen is a 22 y.o. G2P1001 at [redacted]w[redacted]d being seen today to start prenatal care at the [facility].  She is currently monitored for the following issues for this low-risk pregnancy and has Supervision of other normal pregnancy, antepartum; Late prenatal care affecting pregnancy in second trimester- at [redacted]w[redacted]d; Nausea/vomiting in pregnancy; History of anemia; and Short interval between pregnancies affecting pregnancy, antepartum on their problem list.  Patient reports nausea and vomiting.  Contractions: Not present. Vag. Bleeding: None.  Movement: Absent. Denies leaking of fluid.   Indications for ASA therapy (per uptodate) One of the following: Previous pregnancy with preeclampsia, especially early onset and with an adverse outcome No Multifetal gestation No Chronic hypertension No Type 1 or 2 diabetes mellitus No Chronic kidney disease No Autoimmune disease (antiphospholipid syndrome, systemic lupus erythematosus) No  Two or more of the following: Nulliparity No Obesity (body mass index >30 kg/m2) No Family history of preeclampsia in mother or sister No Age >=35 years No Sociodemographic characteristics (African American race, low socioeconomic level) Yes Personal risk factors (eg, previous pregnancy with low birth weight or small for gestational age infant, previous adverse pregnancy outcome [eg, stillbirth], interval >10 years between pregnancies) No   The following portions of the patient's history were reviewed and updated as appropriate: allergies, current medications, past family history, past medical history, past social history, past surgical history and problem list. Problem list updated.  Objective:   Vitals:   11/23/22 0836 11/23/22 0841  BP: 105/65   Pulse: 82   Temp: (!) 97.4 F (36.3 C)   Weight: 114 lb 3.2 oz (51.8 kg)   Height:  5' 4.5"  (1.638 m)    Fetal Status: Fetal Heart Rate (bpm): 177   Movement: Absent      Physical Exam Vitals and nursing note reviewed. Exam conducted with a chaperone present Rubin Payor RN).  Constitutional:      General: She is not in acute distress.    Appearance: Normal appearance. She is well-developed.  HENT:     Head: Normocephalic and atraumatic.     Right Ear: External ear normal.     Left Ear: External ear normal.     Nose: Nose normal. No congestion or rhinorrhea.     Mouth/Throat:     Lips: Pink.     Mouth: Mucous membranes are moist.     Dentition: Normal dentition. No dental caries.     Pharynx: Oropharynx is clear. Uvula midline.     Comments: Dentition: good Eyes:     General: No scleral icterus.    Conjunctiva/sclera: Conjunctivae normal.  Neck:     Thyroid: No thyroid mass or thyromegaly.  Cardiovascular:     Rate and Rhythm: Normal rate.     Pulses: Normal pulses.     Comments: Extremities are warm and well perfused Pulmonary:     Effort: Pulmonary effort is normal.     Breath sounds: Normal breath sounds.  Chest:     Chest wall: No mass.  Breasts:    Tanner Score is 5.     Breasts are symmetrical.     Right: Normal. No mass, nipple discharge or skin change.     Left: Normal. No mass, nipple discharge or skin change.  Abdominal:     General: Abdomen is flat.     Palpations: Abdomen is soft.     Tenderness: There is  no abdominal tenderness.     Comments: Gravid   Genitourinary:    General: Normal vulva.     Exam position: Lithotomy position.     Pubic Area: No rash.      Tanner stage (genital): 5.     Labia:        Right: No rash.        Left: No rash.      Vagina: Normal. No vaginal discharge.     Cervix: No cervical motion tenderness or friability.     Uterus: Normal. Enlarged (Gravid 12- 14 size). Not tender.      Adnexa: Right adnexa normal and left adnexa normal.     Rectum: Normal. No external hemorrhoid.  Musculoskeletal:     Right  lower leg: No edema.     Left lower leg: No edema.  Lymphadenopathy:     Cervical: No cervical adenopathy.     Upper Body:     Right upper body: No axillary adenopathy.     Left upper body: No axillary adenopathy.  Skin:    General: Skin is warm.     Capillary Refill: Capillary refill takes less than 2 seconds.  Neurological:     Mental Status: She is alert.     Assessment and Plan:  Pregnancy: G2P1001 at [redacted]w[redacted]d  1. Supervision of other normal pregnancy, antepartum -22 year old female in clinic today for prenatal care. Patient excited about the pregnancy -Patient states last LMP 08/07/22, -does not appear to be 15 weeks based on FH/uterine height -dating/ anatomy U/S ordered   -Patient was not taking PNV due to NV- taking it now -PAP= done 03/15/22, NILM, repeat in 3 years -Denies alcohol and drug use.  UDS performed today.  -Patient received dental care years ago.  Patient given dental information.   -Agrees to Maternti21 and AFP -Patient states she never received COVID vaccines in the past - encouraged to get a COVID vaccine- declined today  Patient to receive Flu vaccine today  -ASA recommendation discussed-pt does not qualify for ASA  -total weight gain of 25-35 discussed today -EDPS= 0, will continue to monitor.     - Prenatal Profile I - Hgb Fractionation Cascade - Lead, blood (adult age 16 yrs or greater) - MaterniT 21 plus Core, Blood - AFP, Serum, Open Spina Bifida  2. Late prenatal care affecting pregnancy in second trimester -entered care at approx 103w3d  3. Nausea/vomiting in pregnancy -was seen by [facility] and given Rx for Unisom and Vitamin B6- pt states she has enough of these  -Patient reports nausea and vomiting.  Patient encouraged to eat meals high in protein every 2 hours for nausea and also given resource sheet. Will continue to monitor.    4. History of anemia -anemic with previous pregnancy  -Hgb today= 13.7  5. Short interval between pregnancies  affecting pregnancy, antepartum -last baby was born in November 2023    Discussed overview of care and coordination with inpatient delivery practices including [facility],  [facility].    Preterm labor symptoms and general obstetric precautions including but not limited to vaginal bleeding, contractions, leaking of fluid and fetal movement were reviewed in detail with the patient.  Please refer to After Visit Summary for other counseling recommendations.   Return in about 4 weeks (around 12/21/2022) for Routine Prenatal Care.  No future appointments.  Lenice Llamas, [place]

## 2022-11-28 LAB — PREGNANCY, INITIAL SCREEN
Antibody Screen: NEGATIVE
Basophils Absolute: 0 10*3/uL (ref 0.0–0.2)
Basos: 0 %
Chlamydia trachomatis, NAA: NEGATIVE
EOS (ABSOLUTE): 0.1 10*3/uL (ref 0.0–0.4)
Eos: 1 %
Glucose, UA: NEGATIVE
HCV Ab: NONREACTIVE
HIV Screen 4th Generation wRfx: NONREACTIVE
Hematocrit: 42.4 % (ref 34.0–46.6)
Hemoglobin: 13.7 g/dL (ref 11.1–15.9)
Hepatitis B Surface Ag: NEGATIVE
Immature Grans (Abs): 0 10*3/uL (ref 0.0–0.1)
Immature Granulocytes: 0 %
Ketones, UA: NEGATIVE
Leukocytes,UA: NEGATIVE
Lymphocytes Absolute: 1.9 10*3/uL (ref 0.7–3.1)
Lymphs: 26 %
MCH: 28.4 pg (ref 26.6–33.0)
MCHC: 32.3 g/dL (ref 31.5–35.7)
MCV: 88 fL (ref 79–97)
Monocytes Absolute: 0.4 10*3/uL (ref 0.1–0.9)
Monocytes: 6 %
Neisseria Gonorrhoeae by PCR: NEGATIVE
Neutrophils Absolute: 5 10*3/uL (ref 1.4–7.0)
Neutrophils: 67 %
Nitrite, UA: NEGATIVE
Platelets: 181 10*3/uL (ref 150–450)
RBC, UA: NEGATIVE
RBC: 4.83 x10E6/uL (ref 3.77–5.28)
RDW: 13.5 % (ref 11.7–15.4)
RPR Ser Ql: NONREACTIVE
Rh Factor: POSITIVE
Rubella Antibodies, IGG: 1.29 {index} (ref >0.99)
Specific Gravity, UA: 1.028 (ref 1.005–1.030)
Urobilinogen, Ur: 1 mg/dL (ref 0.2–1.0)
WBC: 7.4 10*3/uL (ref 3.4–10.8)
pH, UA: 6.5 (ref 5.0–7.5)

## 2022-11-28 LAB — MATERNIT 21 PLUS CORE, BLOOD
Fetal Fraction: 23
Result (T21): NEGATIVE
Trisomy 13 (Patau syndrome): NEGATIVE
Trisomy 18 (Edwards syndrome): NEGATIVE
Trisomy 21 (Down syndrome): NEGATIVE

## 2022-11-28 LAB — MICROSCOPIC EXAMINATION
Bacteria, UA: NONE SEEN
Casts: NONE SEEN /[LPF]
WBC, UA: NONE SEEN /[HPF] (ref 0–5)

## 2022-11-28 LAB — AFP, SERUM, OPEN SPINA BIFIDA
AFP MoM: 0.21
AFP Value: 7.8 ng/mL
Gest. Age on Collection Date: 15.3 wk
Maternal Age At EDD: 22.7 a
OSBR Risk 1 IN: 10000
Test Results:: NEGATIVE
Weight: 114 [lb_av]

## 2022-11-28 LAB — HGB FRACTIONATION CASCADE
Hgb A2: 2.6 % (ref 1.8–3.2)
Hgb A: 97.4 % (ref 96.4–98.8)
Hgb F: 0 % (ref 0.0–2.0)
Hgb S: 0 %

## 2022-11-28 LAB — URINE CULTURE, OB REFLEX

## 2022-11-28 LAB — HCV INTERPRETATION

## 2022-11-28 LAB — LEAD, BLOOD (ADULT >= 16 YRS): Lead-Whole Blood: <1 ug/dL (ref 0.0–3.4)

## 2022-11-28 NOTE — Progress Notes (Signed)
Per [facility], client has [place] appt scheduled 01/18/23 at 1300 ([place]). Jossie Ng, RN

## 2022-11-29 LAB — TOXASSURE FLEX 15, UR
6-ACETYLMORPHINE IA: NEGATIVE ng/mL
7-aminoclonazepam: NOT DETECTED ng/mg{creat}
AMPHETAMINES IA: NEGATIVE ng/mL
Alpha-hydroxyalprazolam: NOT DETECTED ng/mg{creat}
Alpha-hydroxymidazolam: NOT DETECTED ng/mg{creat}
Alpha-hydroxytriazolam: NOT DETECTED ng/mg{creat}
Alprazolam: NOT DETECTED ng/mg{creat}
BARBITURATES IA: NEGATIVE ng/mL
BUPRENORPHINE: NEGATIVE
Benzodiazepines: NEGATIVE
Buprenorphine: NOT DETECTED ng/mg{creat}
CANNABINOIDS IA: NEGATIVE ng/mL
COCAINE METABOLITE IA: NEGATIVE ng/mL
Clonazepam: NOT DETECTED ng/mg{creat}
Creatinine: 263 mg/dL
Desalkylflurazepam: NOT DETECTED ng/mg{creat}
Desmethyldiazepam: NOT DETECTED ng/mg{creat}
Desmethylflunitrazepam: NOT DETECTED ng/mg{creat}
Diazepam: NOT DETECTED ng/mg{creat}
ETHYL ALCOHOL Enzymatic: NEGATIVE g/dL
FENTANYL: NEGATIVE
Fentanyl: NOT DETECTED ng/mg{creat}
Flunitrazepam: NOT DETECTED ng/mg{creat}
Lorazepam: NOT DETECTED ng/mg{creat}
METHADONE IA: NEGATIVE ng/mL
METHADONE MTB IA: NEGATIVE ng/mL
Midazolam: NOT DETECTED ng/mg{creat}
Norbuprenorphine: NOT DETECTED ng/mg{creat}
Norfentanyl: NOT DETECTED ng/mg{creat}
OPIATE CLASS IA: NEGATIVE ng/mL
OXYCODONE CLASS IA: NEGATIVE ng/mL
Oxazepam: NOT DETECTED ng/mg{creat}
PHENCYCLIDINE IA: NEGATIVE ng/mL
TAPENTADOL, IA: NEGATIVE ng/mL
TRAMADOL IA: NEGATIVE ng/mL
Temazepam: NOT DETECTED ng/mg{creat}

## 2022-12-13 ENCOUNTER — Other Ambulatory Visit: Payer: Self-pay

## 2022-12-13 ENCOUNTER — Emergency Department
Admission: EM | Admit: 2022-12-13 | Discharge: 2022-12-13 | Disposition: A | Discharge status: Home / Self Care | Payer: Medicaid Other | Attending: Emergency Medicine | Admitting: Emergency Medicine

## 2022-12-13 ENCOUNTER — Telehealth: Payer: Self-pay | Admitting: Obstetrics & Gynecology

## 2022-12-13 DIAGNOSIS — R519 Headache, unspecified: Secondary | ICD-10-CM | POA: Insufficient documentation

## 2022-12-13 DIAGNOSIS — O99891 Other specified diseases and conditions complicating pregnancy: Secondary | ICD-10-CM | POA: Insufficient documentation

## 2022-12-13 MED ORDER — SODIUM CHLORIDE 0.9 % IV BOLUS
1000.0000 mL | Freq: Once | INTRAVENOUS | Status: AC
  Administered 2022-12-13: 1000 mL via INTRAVENOUS

## 2022-12-13 MED ORDER — SODIUM CHLORIDE 0.9 % IV SOLN
12.5000 mg | Freq: Once | INTRAVENOUS | Status: AC
  Administered 2022-12-13: 12.5 mg via INTRAVENOUS
  Filled 2022-12-13: qty 12.5

## 2022-12-13 MED ORDER — PROMETHAZINE HCL 12.5 MG PO TABS: 12.5000 mg | ORAL_TABLET | Freq: Four times a day (QID) | ORAL | 0 refills | Status: DC | PRN

## 2022-12-13 NOTE — Telephone Encounter (Signed)
has headache from yesterday, would like to speak with someone.

## 2022-12-13 NOTE — ED Triage Notes (Signed)
Pt reports:  Headache Ongoing 2 days Worsening Unable to sleep Pregnant  [redacted]weeks

## 2022-12-13 NOTE — ED Provider Notes (Signed)
[facility] Provider Note    Event Date/Time   First MD Initiated Contact with Patient 12/13/22 1811     (approximate)   History   Chief Complaint Headache   HPI  Kendra Chen is a 22 y.o. female G2P1001 at approximately 13 weeks of pregnancy who presents to the ED complaining of headache.  Patient reports that she has been dealing with gradually worsening headache affecting her entire head over the past 2 days.  She describes it as a dull ache that is not exacerbated or alleviated by anything in particular.  She has been having issues with nausea throughout her pregnancy, denies any worsening with this headache.  She has not had any fevers or neck stiffness.  She also denies any vision changes, speech changes, numbness, or weakness.  She has been taking Tylenol at home without relief.     Physical Exam   Triage Vital Signs: ED Triage Vitals [12/13/22 1722]  Encounter Vitals Group     BP 127/70     Systolic BP Percentile      Diastolic BP Percentile      Pulse Rate 88     Resp 18     Temp 98.5 F (36.9 C)     Temp Source Oral     SpO2 97 %     Weight 114 lb (51.7 kg)     Height 5\' 3"  (1.6 m)     Head Circumference      Peak Flow      Pain Score 10     Pain Loc      Pain Education      Exclude from Growth Chart     Most recent vital signs: Vitals:   12/13/22 1722 12/13/22 1914  BP: 127/70 121/74  Pulse: 88 81  Resp: 18 17  Temp: 98.5 F (36.9 C)   SpO2: 97% 98%    Constitutional: Alert and oriented. Eyes: Conjunctivae are normal. Head: Atraumatic. Nose: No congestion/rhinnorhea. Mouth/Throat: Mucous membranes are moist.  Neck: Supple with no meningismus. Cardiovascular: Normal rate, regular rhythm. Grossly normal heart sounds.  2+ radial pulses bilaterally. Respiratory: Normal respiratory effort.  No retractions. Lungs CTAB. Gastrointestinal: Soft and nontender. No distention. Musculoskeletal: No lower extremity tenderness nor  edema.  Neurologic:  Normal speech and language. No gross focal neurologic deficits are appreciated.    ED Results / Procedures / Treatments   Labs (all labs ordered are listed, but only abnormal results are displayed) Labs Reviewed - No data to display   PROCEDURES:  Critical Care performed: No  Procedures   MEDICATIONS ORDERED IN ED: Medications  promethazine (PHENERGAN) 12.5 mg in sodium chloride 0.9 % 50 mL IVPB (0 mg Intravenous Stopped 12/13/22 2019)  sodium chloride 0.9 % bolus 1,000 mL (0 mLs Intravenous Stopped 12/13/22 2019)     IMPRESSION / MDM / ASSESSMENT AND PLAN / ED COURSE  I reviewed the triage vital signs and the nursing notes.                              22 y.o. female G2P1001 at approximately 13 weeks of pregnancy who presents to the ED complaining of gradually worsening headache over the past 2 days.  Patient's presentation is most consistent with acute complicated illness / injury requiring diagnostic workup.  Differential diagnosis includes, but is not limited to, migraine headache, tension headache, SAH, meningitis.  Patient well-appearing and in no acute  distress, vital signs are unremarkable.  Patient has a nonfocal neurologic exam and no features concerning for either SAH or meningitis.  Do not feel imaging indicated at this time, but she has not had any relief with Tylenol and we will treat with IV Phenergan, also give IV fluid bolus.  Patient reports feeling better following IV Phenergan and she is appropriate for discharge home with outpatient follow-up.  We will prescribe Phenergan for use as needed and she was counseled to return to the ED for new or worsening symptoms, patient agrees with plan.      FINAL CLINICAL IMPRESSION(S) / ED DIAGNOSES   Final diagnoses:  Acute nonintractable headache, unspecified headache type     Rx / DC Orders   ED Discharge Orders          Ordered    promethazine (PHENERGAN) 12.5 MG tablet  Every 6  hours PRN        12/13/22 2112             Note:  This document was prepared using Dragon voice recognition software and may include unintentional dictation errors.   Chesley Noon, MD 12/13/22 2113

## 2022-12-13 NOTE — Telephone Encounter (Signed)
Call returned to client at approximately 3:00 pm and message left to return call with number provided. Approximately 45 minutes later client returned call and Huel Cote transferred call to clinic. Client reports onset of headache at 0800 yesterday am. States has been continuous since that time with no relief. Reports unable to sleep last night due to headache pain. Reports pain feels like a "whole bunch of pressure on the back of my head". Has tried Tylenol 500mg  at 2 different times. Counseled may take 2 tablets at a time per bottle directions. Denies visual spot / floaters / lines / any visual changes. Denies any other problems. Food intake today has been 1 banana, ramen noodles and 3 cheese enchiladas. Encouraged to increase protein intake and fluid intake. Per consult with Hazle Coca CNM, ED evaluation needed and client so counseled. Client agreed with plan to go for ED evaluation. Jossie Ng, RN

## 2022-12-13 NOTE — Telephone Encounter (Signed)
Please give me a call back I have been having a Headache for the past 2 days I am concern I want to rule out if its preeclampsia

## 2022-12-20 NOTE — Progress Notes (Unsigned)
[facility]  PRENATAL VISIT NOTE  Subjective:  Artemisia Ritz is a 22 y.o. G2P1001 at [redacted]w[redacted]d being seen today for ongoing prenatal care.  She is currently monitored for the following issues for this low-risk pregnancy and has Supervision of other normal pregnancy, antepartum; Late prenatal care affecting pregnancy in second trimester- at [redacted]w[redacted]d; Nausea/vomiting in pregnancy; History of anemia; and Short interval between pregnancies affecting pregnancy, antepartum on their problem list.  Patient reports {[id number]}.   .  .   . Denies leaking of fluid/ROM.   The following portions of the patient's history were reviewed and updated as appropriate: allergies, current medications, past family history, past medical history, past social history, past surgical history and problem list. Problem list updated.  Objective:  There were no vitals filed for this visit.  Fetal Status:           General:  Alert, oriented and cooperative. Patient is in no acute distress.  Skin: Skin is warm and dry. No rash noted.   Cardiovascular: Normal heart rate noted  Respiratory: Normal respiratory effort, no problems with respiration noted  Abdomen: Soft, gravid, appropriate for gestational age.        Pelvic: Cervical exam deferred        Extremities: Normal range of motion.     Mental Status: Normal mood and affect. Normal behavior. Normal judgment and thought content.   Assessment and Plan:  Pregnancy: G2P1001 at 19wd  1. Supervision of other normal pregnancy, antepartum -taking PNV daily? -weight -exercise -went to ER on 11/5 with HA- treated and sent home  2. Nausea/vomiting in pregnancy -resolved?  3. [redacted] weeks gestation of pregnancy -anatomy scan scheduled for 01/18/23- counsel to call and ask about sooner appointment    Preterm labor symptoms and general obstetric precautions including but not limited to vaginal bleeding, contractions, leaking of fluid  and fetal movement were reviewed in detail with the patient. Please refer to After Visit Summary for other counseling recommendations.  No follow-ups on file.  Future Appointments  Date Time Provider Department Center  12/21/2022  8:40 AM [facility] PROVIDER AC-[facility] None    Lenice Llamas, FNP

## 2022-12-21 ENCOUNTER — Ambulatory Visit: Payer: Medicaid Other | Admitting: Family Medicine

## 2022-12-21 VITALS — BP 102/61 | HR 75 | Temp 97.0°F | Wt 113.6 lb

## 2022-12-21 DIAGNOSIS — Z113 Encounter for screening for infections with a predominantly sexual mode of transmission: Secondary | ICD-10-CM

## 2022-12-21 DIAGNOSIS — O219 Vomiting of pregnancy, unspecified: Secondary | ICD-10-CM

## 2022-12-21 DIAGNOSIS — Z3482 Encounter for supervision of other normal pregnancy, second trimester: Secondary | ICD-10-CM

## 2022-12-21 DIAGNOSIS — Z3480 Encounter for supervision of other normal pregnancy, unspecified trimester: Secondary | ICD-10-CM

## 2022-12-21 DIAGNOSIS — Z3A19 19 weeks gestation of pregnancy: Secondary | ICD-10-CM

## 2022-12-21 LAB — WET PREP FOR TRICH, YEAST, CLUE
Trichomonas Exam: NEGATIVE
Yeast Exam: NEGATIVE

## 2022-12-21 NOTE — Progress Notes (Addendum)
Remains undecided as to post-partum BCM - verified has resource pamphlet previously given. Aware of 01/18/23 [facility] [place] appt and reminder card given. Kendra Ng, RN Wet prep reviewed by Aliene Altes FNP-C. Kendra Ng, RN

## 2022-12-23 LAB — CHLAMYDIA/GC NAA, CONFIRMATION
Chlamydia trachomatis, NAA: NEGATIVE
Neisseria gonorrhoeae, NAA: NEGATIVE

## 2023-01-18 ENCOUNTER — Ambulatory Visit: Payer: Medicaid Other | Admitting: Family Medicine

## 2023-01-18 VITALS — BP 98/64 | HR 80 | Temp 98.0°F | Wt 117.2 lb

## 2023-01-18 DIAGNOSIS — Z3480 Encounter for supervision of other normal pregnancy, unspecified trimester: Secondary | ICD-10-CM

## 2023-01-18 DIAGNOSIS — B3731 Acute candidiasis of vulva and vagina: Secondary | ICD-10-CM

## 2023-01-18 DIAGNOSIS — O219 Vomiting of pregnancy, unspecified: Secondary | ICD-10-CM

## 2023-01-18 DIAGNOSIS — Z3482 Encounter for supervision of other normal pregnancy, second trimester: Secondary | ICD-10-CM

## 2023-01-18 MED ORDER — CLOTRIMAZOLE 1 % EX CREA: 1.0000 | TOPICAL_CREAM | Freq: Two times a day (BID) | CUTANEOUS | Status: DC

## 2023-01-18 NOTE — Patient Instructions (Signed)
It was wonderful to see you today. Thank you for allowing me to be a part of your care. Below is a short summary of what we discussed at your visit today:  Yeast infection Clotrimazole cream on the external labia and introitus twice daily for 7 days. Call if no relief.   Pregnancy Updated your gestational age. Now 18 weeks 3 days today based on October ultrasound.  Next prenatal visit in 4 weeks.   Wrist pain Sounds like carpal tunnel.  Try warmth, ice, and muscle rubs for symptom relief.  Try night time wrist splints that hold your wrist in a neutral position.   Prenatal Classes Go to [email or web address] for more information on the pregnancy and child birth classes that [place] has to offer.    Fayette Pho, MD [facility] Department

## 2023-01-18 NOTE — Progress Notes (Signed)
Verified has [facility] contact card. Aware of [facility] [place] this pm at 1300 ([place]). Counseled regarding 28 week labs due at next appt. 4 week MHC RV appt scheduled and reminder card given. Jossie Ng, RN

## 2023-01-18 NOTE — Assessment & Plan Note (Signed)
Resolved completely. Will remove from active problem list.

## 2023-01-18 NOTE — Assessment & Plan Note (Signed)
Both HPI and physical exam consistent with candidiasis. No herpetic lesions visualized. Previous vaginal swab negative for G/C. Patient reports her partner was tested and negative as well. Will treat with clotrimazole cream BID x 7 days externally. Patient to call if no relief.

## 2023-01-18 NOTE — Assessment & Plan Note (Signed)
Updated GA based on October [facility] [place], re-dating appropriate because incongruent >7 days. Fundal height appropriate for new GA. TWG -5 lb given n/v in first trimester, however now completely resolved and she is eating much better. Will monitor for weight gain at next PNV. Discussed goal of 8 bottles of water per day, she is currently at 3/day.  - taking PNV - anatomy US today at 1pm w/ [facility] - next PNV 4 weeks

## 2023-01-18 NOTE — Progress Notes (Signed)
[facility]  PRENATAL VISIT NOTE  Subjective:  Kendra Chen is a 22 y.o. G2P1001 at [redacted]w[redacted]d being seen today for ongoing prenatal care.  She is currently monitored for the following issues for this low-risk pregnancy and has Supervision of other normal pregnancy, antepartum; Late prenatal care affecting pregnancy in second trimester- at [redacted]w[redacted]d; Nausea/vomiting in pregnancy; History of anemia; Short interval between pregnancies affecting pregnancy, antepartum; and Vulvovaginal candidiasis on their problem list.  Patient reports carpal tunnel symptoms.  Contractions: Not present. Vag. Bleeding: None.  Movement: Absent (Patient has not yet felt fetus move). Denies leaking of fluid/ROM.   The following portions of the patient's history were reviewed and updated as appropriate: allergies, current medications, past family history, past medical history, past social history, past surgical history and problem list. Problem list updated.  Objective:   Vitals:   01/18/23 0821  BP: 98/64  Pulse: 80  Temp: 98 F (36.7 C)  Weight: 117 lb 3.2 oz (53.2 kg)    Fetal Status: Fetal Heart Rate (bpm): 153 Fundal Height: 18 cm Movement: Absent (Patient has not yet felt fetus move)     General:  Alert, oriented and cooperative. Patient is in no acute distress.  Skin: Skin is warm and dry. No rash noted.   Cardiovascular: Normal heart rate noted  Respiratory: Normal respiratory effort, no problems with respiration noted  Abdomen: Soft, gravid, appropriate for gestational age.  Pain/Pressure: Present (Intermittent inguinal cramping with exertion)     Pelvic:  External exam performed, labia minora erythematous with thick white plaque covering  Extremities: Normal range of motion.     Mental Status: Normal mood and affect. Normal behavior. Normal judgment and thought content.   Assessment and Plan:  Pregnancy: G2P1001 at [redacted]w[redacted]d  Vulvovaginal candidiasis Assessment  & Plan: Both HPI and physical exam consistent with candidiasis. No herpetic lesions visualized. Previous vaginal swab negative for G/C. Patient reports her partner was tested and negative as well. Will treat with clotrimazole cream BID x 7 days externally. Patient to call if no relief.   Orders: -     Clotrimazole; Apply 1 Application topically 2 (two) times daily.  Supervision of other normal pregnancy, antepartum Assessment & Plan: Updated GA based on October [facility] [place], re-dating appropriate because incongruent >7 days. Fundal height appropriate for new GA. TWG -5 lb given n/v in first trimester, however now completely resolved and she is eating much better. Will monitor for weight gain at next PNV. Discussed goal of 8 bottles of water per day, she is currently at 3/day.  - taking PNV - anatomy US today at 1pm w/ [facility] - next PNV 4 weeks    Nausea/vomiting in pregnancy Assessment & Plan: Resolved completely. Will remove from active problem list.    Preterm labor symptoms and general obstetric precautions including but not limited to vaginal bleeding, contractions, leaking of fluid and fetal movement were reviewed in detail with the patient. Please refer to After Visit Summary for other counseling recommendations.   No follow-ups on file.  Future Appointments  Date Time Provider Department Center  02/15/2023  8:20 AM [facility] PROVIDER [facility] None   Clydene Fake, MD

## 2023-01-25 ENCOUNTER — Encounter: Payer: Self-pay | Admitting: Family Medicine

## 2023-01-25 DIAGNOSIS — N856 Intrauterine synechiae: Secondary | ICD-10-CM | POA: Insufficient documentation

## 2023-01-25 HISTORY — DX: Intrauterine synechiae: N85.6

## 2023-01-25 NOTE — Addendum Note (Signed)
Addended by: Heywood Bene on: 01/25/2023 03:15 PM   Modules accepted: Orders

## 2023-02-13 ENCOUNTER — Ambulatory Visit
Admission: EM | Admit: 2023-02-13 | Discharge: 2023-02-13 | Disposition: A | Discharge status: Home / Self Care | Payer: Medicaid Other | Attending: Family Medicine | Admitting: Family Medicine

## 2023-02-13 DIAGNOSIS — N898 Other specified noninflammatory disorders of vagina: Secondary | ICD-10-CM | POA: Insufficient documentation

## 2023-02-13 DIAGNOSIS — Z3A22 22 weeks gestation of pregnancy: Secondary | ICD-10-CM | POA: Diagnosis present

## 2023-02-13 DIAGNOSIS — B3731 Acute candidiasis of vulva and vagina: Secondary | ICD-10-CM | POA: Insufficient documentation

## 2023-02-13 DIAGNOSIS — B9689 Other specified bacterial agents as the cause of diseases classified elsewhere: Secondary | ICD-10-CM | POA: Insufficient documentation

## 2023-02-13 DIAGNOSIS — N760 Acute vaginitis: Secondary | ICD-10-CM | POA: Diagnosis present

## 2023-02-13 LAB — WET PREP, GENITAL
Sperm: NONE SEEN
Trich, Wet Prep: NONE SEEN
WBC, Wet Prep HPF POC: >10 — AB (ref <10)

## 2023-02-13 MED ORDER — METRONIDAZOLE 0.75 % VA GEL: 1.0000 | Freq: Every day | VAGINAL | 0 refills | Status: DC

## 2023-02-13 MED ORDER — FLUCONAZOLE 150 MG PO TABS: 150.0000 mg | ORAL_TABLET | Freq: Once | ORAL | 0 refills | Status: AC

## 2023-02-13 NOTE — ED Triage Notes (Signed)
 Pt c/o vaginal itching & discharge x3 mon. States 22 wks preg. Denies any concerns for STD.

## 2023-02-13 NOTE — Discharge Instructions (Signed)
 Stop by the pharmacy to pick up your prescriptions.  Follow up with your OB provider as scheduled.

## 2023-02-13 NOTE — ED Provider Notes (Signed)
 [facility]    CSN: [id number] Arrival date & time: 02/13/23  9047      History   Chief Complaint Chief Complaint  Patient presents with   Vaginal Itching   Vaginal Discharge     HPI HPI Elva Mauro is a 23 y.o. female.    Shenice Gamm presents for vaginal itching for 3 months. She is [redacted] weeks pregnant and follows with [facility] Department.  States that they do not really do anything about it but tell her not to wash with scented soap.  She has vaginal discharge with fishy odor.  She and her partner have been STD tested and  it was normal. Declines STD testing here.     Tried using unscented soap to wash her vagina and taking warm bathes. Has  not had any antibiotics in last 30 days.   Denies known STI exposure. No fever, dysuria, urinary frequency, urinary urgency, vaginal bleeding or contractions.  She has not felt baby move but states that she has an anterior placenta.  She has tightness in her lower abdominal. Has an OB appointment in a few days.        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   Vulvovaginal candidiasis 01/18/2023   Supervision of other normal pregnancy, antepartum 11/23/2022   Late prenatal care affecting pregnancy in second trimester- at [redacted]w[redacted]d 11/23/2022   Nausea/vomiting in pregnancy 11/23/2022   History of anemia 11/23/2022   Short interval between pregnancies affecting pregnancy, antepartum 11/23/2022    Past Surgical History:  Procedure Laterality Date   Repair of cervical tear post-partum  12/30/2021    OB History     Gravida  2   Para  1   Term  1   Preterm      AB      Living  1      SAB      IAB      Ectopic      Multiple      Live Births  1            Home Medications    Prior to Admission medications   Medication Sig Start Date End Date Taking?  Authorizing Provider  fluconazole  (DIFLUCAN ) 150 MG tablet Take 1 tablet (150 mg total) by mouth once for 1 dose. 02/13/23 02/13/23 Yes Zandon Talton, DO  metroNIDAZOLE  (METROGEL ) 0.75 % vaginal gel Place 1 Applicatorful vaginally at bedtime. 02/13/23  Yes Harley Fitzwater, DO  Prenatal Vit-Fe Fumarate-FA (MULTIVITAMIN-PRENATAL) 27-0.8 MG TABS tablet Take 1 tablet by mouth daily at 12 noon.   Yes [provider]    Family History Family History  Problem Relation Age of Onset   Healthy Mother    Healthy Brother    Healthy Son    Lung cancer Maternal Grandmother    Breast cancer Maternal Grandmother    Healthy Maternal Grandfather    Healthy Half-Sister    Healthy Half-Brother    Healthy Half-Brother     Social History Social History   Tobacco Use   Smoking status: Former    Types: E-cigarettes    Quit date: 10/04/2022    Years since quitting: 0.3    Passive exposure: Past   Smokeless tobacco: Never  Vaping Use   Vaping status: Former   Substances: Nicotine, Flavoring  Substance Use Topics  Alcohol use: Not Currently    Comment: 11/23/22 - Last ETOH use in 2022.   Drug use: Never     Allergies   Patient has no known allergies.   Review of Systems Review of Systems: :negative unless otherwise stated in HPI.      Physical Exam Triage Vital Signs ED Triage Vitals  Encounter Vitals Group     BP 02/13/23 1040 109/71     Systolic BP Percentile --      Diastolic BP Percentile --      Pulse Rate 02/13/23 1040 80     Resp 02/13/23 1040 16     Temp 02/13/23 1040 98.7 F (37.1 C)     Temp Source 02/13/23 1040 Oral     SpO2 02/13/23 1040 98 %     Weight 02/13/23 1039 114 lb (51.7 kg)     Height 02/13/23 1039 5' 3 (1.6 m)     Head Circumference --      Peak Flow --      Pain Score 02/13/23 1043 0     Pain Loc --      Pain Education --      Exclude from Growth Chart --    No data found.  Updated Vital Signs BP 109/71 (BP Location: Right Arm)   Pulse 80    Temp 98.7 F (37.1 C) (Oral)   Resp 16   Ht 5' 3 (1.6 m)   Wt 51.7 kg   LMP 08/07/2022 (Exact Date)   SpO2 98%   BMI 20.19 kg/m   Visual Acuity Right Eye Distance:   Left Eye Distance:   Bilateral Distance:    Right Eye Near:   Left Eye Near:    Bilateral Near:     Physical Exam GEN: well appearing female in no acute distress  CVS: well perfused  RESP: speaking in full sentences without pause  ABD: gravid, non-tender, non-distended, no palpable masses   GU: deferred, patient performed self swab      UC Treatments / Results  Labs (all labs ordered are listed, but only abnormal results are displayed) Labs Reviewed  WET PREP, GENITAL - Abnormal; Notable for the following components:      Result Value   Yeast Wet Prep HPF POC PRESENT (*)    Clue Cells Wet Prep HPF POC PRESENT (*)    WBC, Wet Prep HPF POC >10 (*)    All other components within normal limits    EKG   Radiology No results found.  Procedures Procedures (including critical care time)  Medications Ordered in UC Medications - No data to display  Initial Impression / Assessment and Plan / UC Course  I have reviewed the triage vital signs and the nursing notes.  Pertinent labs & imaging results that were available during my care of the patient were reviewed by me and considered in my medical decision making (see chart for details).      Patient is a 23 y.o.SABRA female  who is 22 weeks 1 day pregnant presents for vaginal itching for the past 3 months.  Overall patient is well-appearing and afebrile.  Vital signs stable.  Wet prep showing evidence of yeast vaginitis and bacterial vaginitis but no trichomonas.  Self care instructions given including avoiding douching. Gonorrhea and Chlamydia testing declined. Treatment: MetroGel  nightly for 7 days and Diflucan  for  yeast infection    Return precautions including abdominal pain, fever, chills, nausea, or vomiting given. Discussed MDM, treatment plan and  plan for follow-up with patient who agrees with plan.     Final Clinical Impressions(s) / UC Diagnoses   Final diagnoses:  BV (bacterial vaginosis)  Yeast vaginitis  Vaginal itching  [redacted] weeks gestation of pregnancy     Discharge Instructions      Stop by the pharmacy to pick up your prescriptions.  Follow up with your OB provider as scheduled.       ED Prescriptions     Medication Sig Dispense Auth. Provider   metroNIDAZOLE  (METROGEL ) 0.75 % vaginal gel Place 1 Applicatorful vaginally at bedtime. 70 g Sole Lengacher, DO   fluconazole  (DIFLUCAN ) 150 MG tablet Take 1 tablet (150 mg total) by mouth once for 1 dose. 1 tablet Kriste Berth, DO      PDMP not reviewed this encounter.   Shagun Wordell, DO 02/13/23 1349

## 2023-02-14 NOTE — Progress Notes (Signed)
  [facility] [address], [address] [place] [address] Main phone: [phone]  Prenatal Visit  Subjective:  Kendra Chen is a 23 y.o. G2P1001 at [redacted]w[redacted]d being seen today for ongoing prenatal care.  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  Patient reports  round ligament pain .  Contractions: Not present. Vag. Bleeding: None.  Movement: Present. Denies leaking of fluid/ROM.   The following portions of the patient's history were reviewed and updated as appropriate: allergies, current medications, past family history, past medical history, past social history, past surgical history and problem list. Problem list updated.  Objective:   Vitals:   02/15/23 0830  BP: 102/62  Pulse: 81  Temp: 97.8 F (36.6 C)  Weight: 122 lb 12.8 oz (55.7 kg)    Fetal Status: Fetal Heart Rate (bpm): 155 Fundal Height: 23 cm Movement: Present     General:  Alert, oriented and cooperative. Patient is in no acute distress.  Skin: Skin is warm and dry. No rash noted.   Cardiovascular: Normal heart rate noted  Respiratory: Normal respiratory effort, no problems with respiration noted  Abdomen: Soft, gravid, appropriate for gestational age.  Pain/Pressure: Present     Pelvic: Cervical exam deferred        Extremities: Normal range of motion.  Edema: None  Mental Status: Normal mood and affect. Normal behavior. Normal judgment and thought content.   Assessment and Plan:  Pregnancy: G2P1001 at [redacted]w[redacted]d  1. Supervision of other normal pregnancy, antepartum (Primary) -taking PNV daily -3.2 oz (-0.091 kg) -exercise-walking  outside -reports some round ligament pain- given information on round ligament pain and stretching in pregnancy, reviewed this is a common symptom of pregnancy  3. [redacted] weeks gestation of pregnancy -reviewed anatomy US  from 12/11- anatomy wnl- presence of small uterine synechia- which is a benign finding not requiring follow up  4. Vulvovaginal candidiasis -went to UC on 1/6 with c/o vaginal itching x 3 months -previous testing for BV/yeast negative in November, but positive for yeast in December and treated with clotrimazole   -treated for yeast and BV in the ER with diflucan  and metrogel  -itching has improved greatly     Preterm labor symptoms and general obstetric precautions including but not limited to vaginal bleeding, contractions, leaking of fluid and fetal movement were reviewed in detail with the patient. Please refer to After Visit Summary for other counseling recommendations.  Return in about 4 weeks (around 03/15/2023) for Routine Prenatal Care.  No future appointments.   Verneta Bers, [place]

## 2023-02-15 ENCOUNTER — Ambulatory Visit: Payer: Medicaid Other | Admitting: Family Medicine

## 2023-02-15 ENCOUNTER — Encounter: Payer: Self-pay | Admitting: Family Medicine

## 2023-02-15 VITALS — BP 102/62 | HR 81 | Temp 97.8°F | Wt 122.8 lb

## 2023-02-15 DIAGNOSIS — Z3A22 22 weeks gestation of pregnancy: Secondary | ICD-10-CM

## 2023-02-15 DIAGNOSIS — B3731 Acute candidiasis of vulva and vagina: Secondary | ICD-10-CM

## 2023-02-15 DIAGNOSIS — Z3482 Encounter for supervision of other normal pregnancy, second trimester: Secondary | ICD-10-CM

## 2023-02-15 DIAGNOSIS — Z3480 Encounter for supervision of other normal pregnancy, unspecified trimester: Secondary | ICD-10-CM

## 2023-02-15 DIAGNOSIS — O219 Vomiting of pregnancy, unspecified: Secondary | ICD-10-CM

## 2023-02-15 NOTE — Progress Notes (Signed)
 Here today fr 22.3 week MH RV. Taking PNV every day. Had urgent care visit 02/13/23 for vaginal itching. Is using prescribed Metrogel and states symptoms are improving. Tawny Hopping, RN

## 2023-03-15 ENCOUNTER — Encounter: Payer: Self-pay | Admitting: Nurse Practitioner

## 2023-03-15 ENCOUNTER — Ambulatory Visit: Payer: Medicaid Other | Admitting: Nurse Practitioner

## 2023-03-15 VITALS — BP 109/64 | HR 78 | Temp 98.1°F | Wt 131.2 lb

## 2023-03-15 DIAGNOSIS — Z3482 Encounter for supervision of other normal pregnancy, second trimester: Secondary | ICD-10-CM

## 2023-03-15 DIAGNOSIS — Z3A26 26 weeks gestation of pregnancy: Secondary | ICD-10-CM

## 2023-03-15 DIAGNOSIS — Z3480 Encounter for supervision of other normal pregnancy, unspecified trimester: Secondary | ICD-10-CM

## 2023-03-15 NOTE — Progress Notes (Addendum)
  [facility] [address], [address] [place] [address] Main phone: [phone]  Prenatal Visit  Subjective:  Kendra Chen is a 23 y.o. G2P1001 at [redacted]w[redacted]d being seen today for ongoing prenatal care.  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  Contractions: Irregular. Vag. Bleeding: None.  Movement: Present. Denies leaking of fluid/ROM.   The following portions of the patient's history were reviewed and updated as appropriate: allergies, current medications, past family history, past medical history, past social history, past surgical history and problem list. Problem list updated.  Objective:   Vitals:   03/15/23 0904  BP: 109/64  Pulse: 78  Temp: 98.1 F (36.7 C)  Weight: 131 lb 3.2 oz (59.5 kg)    Fetal Status: Fetal Heart Rate (bpm): 144 Fundal Height: 28 cm Movement: Present  Presentation: Undeterminable  General:  Alert, oriented and cooperative. Patient is in no acute distress.  Skin: Skin is warm and dry. No rash noted.   Cardiovascular: Normal heart rate noted  Respiratory: Normal respiratory effort, no problems with respiration noted  Abdomen: Soft, gravid, appropriate for gestational age.  Pain/Pressure: Absent     Pelvic: Cervical exam deferred        Extremities: Normal range of motion.  Edema: None  Mental Status: Normal mood and affect. Normal behavior. Normal judgment and thought content.   Assessment and Plan:  Pregnancy: G2P1001 at [redacted]w[redacted]d  1. Supervision of other normal  pregnancy, antepartum (Primary) ~Reports taking PNV daily.   ~Reports walking for exercise on the weekends at the park with her son. States on a daily basis she is doing a lot around her house for exercise like cleaning.   Total Weight Gain: 8 lb 3.2 oz (3.719 kg) Weight gain slightly less than should be at this time in pregnancy. Ideally should be gaining between 25-35 pounds by end of pregnancy. Discussed food/eating with patient. She reports not feeling hungry and eating a lot. She said this pregnancy is different from the last because with that one she ate a lot and gained a lot of weight. She said now she is eating but not gaining as much. Encouraged smaller more frequent meals.   EPDS=0 Patient reports feeling well mentally and emotionally at today's visit.   Completed Care Management form. Requested services d/t late entry to care, hospital utilization in antepartum period, and short interpregnancy interval.   2. [redacted] weeks gestation of pregnancy RV in 2 weeks for prenatal and 28 week labs. Anticipatory guidance given.   Preterm labor symptoms and general obstetric precautions including but not limited to vaginal bleeding, contractions, leaking of fluid and fetal movement were reviewed in detail with the patient. Please refer to After Visit Summary for other counseling recommendations.   Return in about 2 weeks (around 03/29/2023) for 28 week prenatal care & labs (before 10:30a or 3p).  Future Appointments  Date Time Provider Department Center  03/29/2023  9:00 AM [facility] PROVIDER AC-[facility] None    Clarita LITTIE Narrow, NP

## 2023-03-15 NOTE — Progress Notes (Signed)
 Patient here for MH RV at 26 3/7. CMHRP and [place] today. Patient aware of 28 week labs at next visit.Rosiland Cooks, RN

## 2023-03-16 NOTE — Progress Notes (Signed)

## 2023-03-29 ENCOUNTER — Encounter: Payer: Self-pay | Admitting: Family Medicine

## 2023-03-29 ENCOUNTER — Ambulatory Visit: Payer: Medicaid Other | Admitting: Family Medicine

## 2023-03-29 VITALS — BP 115/72 | HR 85 | Temp 97.8°F | Wt 135.0 lb

## 2023-03-29 DIAGNOSIS — O99019 Anemia complicating pregnancy, unspecified trimester: Secondary | ICD-10-CM | POA: Insufficient documentation

## 2023-03-29 DIAGNOSIS — Z3483 Encounter for supervision of other normal pregnancy, third trimester: Secondary | ICD-10-CM

## 2023-03-29 DIAGNOSIS — Z3A28 28 weeks gestation of pregnancy: Secondary | ICD-10-CM

## 2023-03-29 DIAGNOSIS — O99013 Anemia complicating pregnancy, third trimester: Secondary | ICD-10-CM

## 2023-03-29 DIAGNOSIS — Z23 Encounter for immunization: Secondary | ICD-10-CM | POA: Diagnosis not present

## 2023-03-29 DIAGNOSIS — Z3480 Encounter for supervision of other normal pregnancy, unspecified trimester: Secondary | ICD-10-CM

## 2023-03-29 HISTORY — DX: Anemia complicating pregnancy, unspecified trimester: O99.019

## 2023-03-29 LAB — HEMOGLOBIN, FINGERSTICK: Hemoglobin: 10.9 g/dL — ABNORMAL LOW (ref 11.1–15.9)

## 2023-03-29 NOTE — Progress Notes (Addendum)
 Here today for 28.3 week MH RV. Taking PNV every day. Denies ED/hospital visits since last RV. 28 week lbs and Tdap today. Tawny Hopping, RN  Tdap given. Hgb 10.9. PO Iron initiated. Tawny Hopping, RN

## 2023-03-29 NOTE — Assessment & Plan Note (Signed)
 Anemia Profile Lab Initiate PO Iron Recheck Hgb monthly

## 2023-03-29 NOTE — Progress Notes (Signed)
  [facility] [address] [place] [address] Main phone: [phone]  Prenatal Visit  Subjective:  Kendra Chen is a 23 y.o. G2P1001 at [redacted]w[redacted]d being seen today for ongoing prenatal care.  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  Contractions: Not present. Vag. Bleeding: None.  Movement: Present. Denies leaking of fluid/ROM.   The following portions of the patient's history were reviewed and updated as appropriate: allergies, current medications, past family history, past medical history, past social history, past surgical history and problem list. Problem list updated.  Objective:   Vitals:   03/29/23 0854  BP: 115/72  Pulse: 85  Temp: 97.8 F (36.6 C)  Weight: 135 lb (61.2 kg)    Fetal Status: Fetal Heart Rate (bpm): 151 Fundal Height: 29 cm Movement: Present     General:  Alert, oriented and cooperative. Patient is in no acute distress.  Skin: Skin is warm and dry. No rash noted.   Cardiovascular: Normal heart rate noted  Respiratory: Normal respiratory effort, no problems with respiration noted  Abdomen: Soft, gravid, appropriate for gestational age.  Pain/Pressure: Present     Pelvic: Cervical exam deferred        Extremities: Normal range of motion.  Edema: None  Mental Status: Normal mood and affect. Normal behavior. Normal judgment and thought content.   Assessment and Plan:  Pregnancy: G2P1001 at [redacted]w[redacted]d  1. [redacted] weeks gestation of pregnancy -routine 28 week labs -tdap today   2.  Supervision of other normal pregnancy, antepartum (Primary) -taking PNV daily 12 lb (5.443 kg) -round ligament pain- reviewed stretches- given stretching sheet  Signs and symptoms of preeclampsia were verbally reviewed with warning signs, when and how to call. A written handout with tips on how to take your blood pressure, as well as warning signs was provided to the patient.     Preterm labor symptoms and general obstetric precautions including but not limited to vaginal bleeding, contractions, leaking of fluid and fetal movement were reviewed in detail with the patient. Please refer to After Visit Summary for other counseling recommendations.  Return in about 2 weeks (around 04/12/2023) for Routine Prenatal Care.  No future appointments.  Lenice Llamas, [place]

## 2023-03-29 NOTE — Addendum Note (Signed)
 Addended by: Tawny Hopping A on: 03/29/2023 10:13 AM   Modules accepted: Orders

## 2023-03-30 LAB — FE+CBC/D/PLT+TIBC+FER+RETIC
Basophils Absolute: 0 10*3/uL (ref 0.0–0.2)
Basos: 0 %
EOS (ABSOLUTE): 0 10*3/uL (ref 0.0–0.4)
Eos: 1 %
Ferritin: 8 ng/mL — ABNORMAL LOW (ref 15–150)
Hematocrit: 34.3 % (ref 34.0–46.6)
Hemoglobin: 11.4 g/dL (ref 11.1–15.9)
Immature Grans (Abs): 0.1 10*3/uL (ref 0.0–0.1)
Immature Granulocytes: 1 %
Iron Saturation: 8 % — CL (ref 15–55)
Iron: 39 ug/dL (ref 27–159)
Lymphocytes Absolute: 1.6 10*3/uL (ref 0.7–3.1)
Lymphs: 21 %
MCH: 28.8 pg (ref 26.6–33.0)
MCHC: 33.2 g/dL (ref 31.5–35.7)
MCV: 87 fL (ref 79–97)
Monocytes Absolute: 0.5 10*3/uL (ref 0.1–0.9)
Monocytes: 6 %
Neutrophils Absolute: 5.5 10*3/uL (ref 1.4–7.0)
Neutrophils: 71 %
Platelets: 139 10*3/uL — ABNORMAL LOW (ref 150–450)
RBC: 3.96 x10E6/uL (ref 3.77–5.28)
RDW: 11.8 % (ref 11.7–15.4)
Retic Ct Pct: 1.7 % (ref 0.6–2.6)
Total Iron Binding Capacity: 498 ug/dL — ABNORMAL HIGH (ref 250–450)
UIBC: 459 ug/dL — ABNORMAL HIGH (ref 131–425)
WBC: 7.7 10*3/uL (ref 3.4–10.8)

## 2023-03-31 ENCOUNTER — Encounter: Payer: Self-pay | Admitting: Family Medicine

## 2023-03-31 DIAGNOSIS — D696 Thrombocytopenia, unspecified: Secondary | ICD-10-CM

## 2023-03-31 HISTORY — DX: Other diseases of the blood and blood-forming organs and certain disorders involving the immune mechanism complicating pregnancy, unspecified trimester: D69.6

## 2023-03-31 LAB — HIV-1/HIV-2 QUALITATIVE RNA
HIV-1 RNA, Qualitative: NONREACTIVE
HIV-2 RNA, Qualitative: NONREACTIVE

## 2023-03-31 LAB — RPR: RPR Ser Ql: NONREACTIVE

## 2023-03-31 LAB — GLUCOSE, 1 HOUR GESTATIONAL: Gestational Diabetes Screen: 85 mg/dL (ref 70–139)

## 2023-04-12 ENCOUNTER — Ambulatory Visit: Payer: Medicaid Other | Admitting: Family Medicine

## 2023-04-12 ENCOUNTER — Encounter: Payer: Self-pay | Admitting: Family Medicine

## 2023-04-12 VITALS — BP 114/70 | HR 89 | Temp 98.1°F | Wt 138.6 lb

## 2023-04-12 DIAGNOSIS — Z3A30 30 weeks gestation of pregnancy: Secondary | ICD-10-CM

## 2023-04-12 DIAGNOSIS — O99013 Anemia complicating pregnancy, third trimester: Secondary | ICD-10-CM

## 2023-04-12 DIAGNOSIS — Z3483 Encounter for supervision of other normal pregnancy, third trimester: Secondary | ICD-10-CM

## 2023-04-12 DIAGNOSIS — Z3480 Encounter for supervision of other normal pregnancy, unspecified trimester: Secondary | ICD-10-CM

## 2023-04-12 MED ORDER — IRON (FERROUS SULFATE) 325 (65 FE) MG PO TABS: 325.0000 mg | ORAL_TABLET | ORAL | Status: DC

## 2023-04-12 NOTE — Progress Notes (Addendum)
 Here today for 30.3 week MH RV. Taking PNV and Iron every day. Denies ED/hospital visits since last RV. PTL s/s info given and reviewed. Tawny Hopping, RN

## 2023-04-12 NOTE — Progress Notes (Signed)
 e4 [facility] [address] [place] [address] Main phone: [phone]  Prenatal Visit  Subjective:  Kendra Chen is a 23 y.o. G2P1001 at [redacted]w[redacted]d being seen today for ongoing prenatal care.  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  Contractions: Irregular. Vag. Bleeding: None.  Movement: Present. Denies leaking of fluid/ROM.   The following portions of the patient's history were reviewed and updated as appropriate: allergies, current medications, past family history, past medical history, past social history, past surgical history and problem list. Problem list updated.  Objective:   Vitals:   04/12/23 0903 04/12/23 0906  BP: 114/70 114/70  Pulse: 89 89  Temp: 98.1 F (36.7 C) 98.1 F (36.7 C)  Weight: 138 lb 9.6 oz (62.9 kg) 138 lb 9.6 oz (62.9 kg)    Fetal Status: Fetal Heart Rate (bpm): 165 Fundal Height: 32 cm Movement: Present     General:  Alert, oriented and cooperative. Patient is in no acute distress.  Skin: Skin is warm and dry. No rash noted.   Cardiovascular: Normal heart rate noted  Respiratory: Normal respiratory effort, no problems with respiration noted  Abdomen: Soft, gravid, appropriate for gestational age.  Pain/Pressure: Present     Pelvic: Cervical exam deferred        Extremities: Normal range of motion.  Edema: None  Mental Status: Normal mood and affect. Normal behavior. Normal judgment and thought  content.   Assessment and Plan:  Pregnancy: G2P1001 at [redacted]w[redacted]d  1. [redacted] weeks gestation of pregnancy (Primary) -reviewed 28 week labs- wnl  2. Supervision of other normal pregnancy, antepartum -taking PNV daily  15 lb 9.6 oz (7.076 kg)  3. Anemia affecting pregnancy in third trimester -last Hgb 03/29/23- taking iron EOD -iron started at last visit but not on med list- ordered today -anemia panel was done- Hgb slightly better than finger stick in office- will continue iron due to iron saturation and ferritin level  Preterm labor symptoms and general obstetric precautions including but not limited to vaginal bleeding, contractions, leaking of fluid and fetal movement were reviewed in detail with the patient. Please refer to After Visit Summary for other counseling recommendations.  Return in about 2 weeks (around 04/26/2023) for Routine Prenatal Care.  No future appointments.  Lenice Llamas, [place]

## 2023-04-26 ENCOUNTER — Ambulatory Visit: Admitting: Family Medicine

## 2023-04-26 ENCOUNTER — Ambulatory Visit

## 2023-04-26 VITALS — BP 114/69 | HR 96 | Temp 98.8°F | Wt 141.0 lb

## 2023-04-26 DIAGNOSIS — Z3A32 32 weeks gestation of pregnancy: Secondary | ICD-10-CM

## 2023-04-26 DIAGNOSIS — Z3480 Encounter for supervision of other normal pregnancy, unspecified trimester: Secondary | ICD-10-CM

## 2023-04-26 DIAGNOSIS — D696 Thrombocytopenia, unspecified: Secondary | ICD-10-CM

## 2023-04-26 DIAGNOSIS — O99113 Other diseases of the blood and blood-forming organs and certain disorders involving the immune mechanism complicating pregnancy, third trimester: Secondary | ICD-10-CM

## 2023-04-26 DIAGNOSIS — Z3483 Encounter for supervision of other normal pregnancy, third trimester: Secondary | ICD-10-CM

## 2023-04-26 DIAGNOSIS — O99013 Anemia complicating pregnancy, third trimester: Secondary | ICD-10-CM

## 2023-04-26 NOTE — Assessment & Plan Note (Signed)
 Doing well. BP normal at 114/69. TWG 18 lb (8.165 kg). Taking prenatal vitamin daily. Next prenatal appointment in 2 weeks.   Discussed or disclosed in AVS: - Breast feeding support through [facility] breast feeding peer counselor program - Mood resources, including 988, Maternal Mental Health Line - Prenatal classes - Pain control during delivery: epidural  desired - Support person during delivery: Husband Kendra Chen, and possibly mother in law - Anticipated contraception: hormonal implant - Pre-eclampsia warning signs: Signs and symptoms of preeclampsia were verbally reviewed with warning signs, when and how to call. A written handout with tips on how to take your blood pressure, as well as warning signs was provided to the patient.

## 2023-04-26 NOTE — Assessment & Plan Note (Signed)
 Will obtain CBC today to trend. If down trending PLT, plan to send to heme.

## 2023-04-26 NOTE — Patient Instructions (Signed)
 Pregnancy Continue taking your prenatal vitamin daily.  Please schedule your next prenatal visit for about 2 weeks from now.  Today we checked your hemoglobin and platelet count. If we see your platelet count going down significantly, we will send you to a hematologist (blood specialist) for consultation and recommendations.   Prenatal Classes If delivering at [facility] with [facility] or [facility]: Go to [email or web address]   If delivering at [facility]: Go to [email or web address] and search for: "Pregnancy and Parenting Classes" "Prepared Childbirth Classes"  Resource regarding exposures that could affect pregnancy: Mother [employer] [facility] [employer] is a Dentist with lots of information on the effects of many medications and exposures on your pregnancy. It is free to use, including their web site, phone line, text service, app, or email and live chat.  [email or web address]/ Email or live chat: MotherToBaby.org Phone: (919)797-5851 Text: [phone] App: search "LactRx"   Maternal Mental Health If you start to develop the below symptoms of depression, please reach out to [place] for an appointment. There is also a Biomedical scientist Health Hotline at [phone] 734-654-7621). This hotline has trained counselors, doulas, and midwifes to real-time support, information, and resources.  Feeling sad or hopeless most of the time Lack of interest in things you used to enjoy Less interest in caring for yourself (dressing, fixing hair) Trouble concentrating Trouble coping with daily tasks Constant worry about your baby Sleeping or eating too much or too little Feeling very anxious or nervous Unexplained irritability or anger Unwanted or scary thoughts Feeling that you are not a good mother Thoughts of hurting yourself or your baby  If you feel you are experiencing a mental health crisis, please reach  out to the National Suicide Prevention Hotline at 1-800-273-TALK 970-068-8843).

## 2023-04-26 NOTE — Progress Notes (Signed)
 Patient here for MH RV at 32 3/7. Kick counts reviewed and cards given. Needs hgb check today.Burt Knack, RN

## 2023-04-26 NOTE — Assessment & Plan Note (Signed)
 Borderline; at last appt, fingerstick Hgb 10.9 but venipuncture anemia panel showed hgb 11.4. Counseled patient today on iron deficiency not yet causing anemia. Discussed iron rich foods.

## 2023-04-26 NOTE — Progress Notes (Signed)
 [facility] [address], [address] [place] [address] Main phone: [phone]  Prenatal Visit  Subjective:  Kendra Chen is a 23 y.o. G2P1001 at [redacted]w[redacted]d being seen today for ongoing prenatal care.  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  Contractions: Irritability. Vag. Bleeding: None.  Movement: Present. Denies leaking of fluid/ROM.   The following portions of the patient's history were reviewed and updated as appropriate: allergies, current medications, past family history, past medical history, past social history, past surgical history and problem list. Problem list updated.  Objective:   Vitals:   04/26/23 1321  BP: 114/69  Pulse: 96  Temp: 98.8 F (37.1 C)  Weight: 141 lb (64 kg)   Fetal Status: Fetal Heart Rate (bpm): 166 (Immediately s/p Braxton Hicks, ~175 bpm, down trended with time) Fundal Height: 32 cm Movement: Present     General:  Alert, oriented and cooperative. Patient is in no acute distress.  Skin: Skin is warm and dry. No rash noted.   Cardiovascular: Normal heart rate noted  Respiratory: Normal respiratory effort, no problems with respiration noted  Abdomen: Soft, gravid, appropriate for gestational age.  Pain/Pressure: Present (Low back pain)     Pelvic: Cervical exam deferred        Extremities: Normal range of motion.     Mental Status: Normal mood and affect. Normal behavior. Normal  judgment and thought content.   Assessment and Plan:  Pregnancy: G2P1001 at [redacted]w[redacted]d  [redacted] weeks gestation of pregnancy  Supervision of other normal pregnancy, antepartum Assessment & Plan: Doing well. BP normal at 114/69. TWG 18 lb (8.165 kg). Taking prenatal vitamin daily. Next prenatal appointment in 2 weeks.   Discussed or disclosed in AVS: - Breast feeding support through [facility] breast feeding peer counselor program - Mood resources, including 988, Maternal Mental Health Line - Prenatal classes - Pain control during delivery: epidural  desired - Support person during delivery: Husband Kendra Chen, and possibly mother in law - Anticipated contraception: hormonal implant - Pre-eclampsia warning signs: Signs and symptoms of preeclampsia were verbally reviewed with warning signs, when and how to call. A written handout with tips on how to take your blood pressure, as well as warning signs was provided to the patient.     Benign gestational thrombocytopenia, antepartum [facility]) Assessment & Plan: Will obtain CBC today to trend. If down trending PLT, plan to send to heme.   Orders: -     CBC  Anemia affecting pregnancy in third trimester Assessment & Plan: Borderline; at last appt, fingerstick Hgb 10.9 but venipuncture anemia panel showed hgb 11.4. Counseled patient today on iron deficiency not yet causing anemia. Discussed iron rich foods.   Orders: -     CBC   Preterm labor symptoms and general obstetric precautions including but not limited to vaginal bleeding, contractions, leaking of fluid and fetal movement were reviewed in detail with the patient. Please refer to After Visit Summary for other counseling recommendations.  No follow-ups on file.  Future Appointments  Date Time  Provider Department Center  05/10/2023  8:20 AM [facility] PROVIDER [facility] None    Clydene Fake, MD

## 2023-04-27 LAB — CBC
Hematocrit: 33.2 % — ABNORMAL LOW (ref 34.0–46.6)
Hemoglobin: 10.7 g/dL — ABNORMAL LOW (ref 11.1–15.9)
MCH: 27.3 pg (ref 26.6–33.0)
MCHC: 32.2 g/dL (ref 31.5–35.7)
MCV: 85 fL (ref 79–97)
Platelets: 139 10*3/uL — ABNORMAL LOW (ref 150–450)
RBC: 3.92 x10E6/uL (ref 3.77–5.28)
RDW: 12.1 % (ref 11.7–15.4)
WBC: 10.3 10*3/uL (ref 3.4–10.8)

## 2023-05-10 ENCOUNTER — Ambulatory Visit: Admitting: Family Medicine

## 2023-05-10 ENCOUNTER — Encounter: Payer: Self-pay | Admitting: Family Medicine

## 2023-05-10 VITALS — BP 115/73 | HR 82 | Temp 98.1°F | Wt 142.4 lb

## 2023-05-10 DIAGNOSIS — O99013 Anemia complicating pregnancy, third trimester: Secondary | ICD-10-CM

## 2023-05-10 DIAGNOSIS — Z3483 Encounter for supervision of other normal pregnancy, third trimester: Secondary | ICD-10-CM

## 2023-05-10 DIAGNOSIS — Z3A34 34 weeks gestation of pregnancy: Secondary | ICD-10-CM

## 2023-05-10 DIAGNOSIS — Z3480 Encounter for supervision of other normal pregnancy, unspecified trimester: Secondary | ICD-10-CM

## 2023-05-10 NOTE — Progress Notes (Signed)
  [facility] [address], [address] [place] [address] Main phone: [phone]  Prenatal Visit  Subjective:  Kendra Chen is a 23 y.o. G2P1001 at [redacted]w[redacted]d being seen today for ongoing prenatal care.  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  Contractions: Not present. Vag. Bleeding: None.  Movement: Present. Denies leaking of fluid/ROM.   The following portions of the patient's history were reviewed and updated as appropriate: allergies, current medications, past family history, past medical history, past social history, past surgical history and problem list. Problem list updated.  Objective:   Vitals:   05/10/23 0826  BP: 115/73  Pulse: 82  Temp: 98.1 F (36.7 C)  Weight: 142 lb 6.4 oz (64.6 kg)    Fetal Status: Fetal Heart Rate (bpm): 147 Fundal Height: 34 cm Movement: Present     General:  Alert, oriented and cooperative. Patient is in no acute distress.  Skin: Skin is warm and dry. No rash noted.   Cardiovascular: Normal heart rate noted  Respiratory: Normal respiratory effort, no problems with respiration noted  Abdomen: Soft, gravid, appropriate for gestational age.  Pain/Pressure: Absent     Pelvic: Cervical exam deferred        Extremities: Normal range of motion.  Edema: None  Mental Status: Normal mood and affect. Normal behavior. Normal judgment and thought content.   Assessment and Plan:  Pregnancy: G2P1001 at  [redacted]w[redacted]d  1. [redacted] weeks gestation of pregnancy Counseled regarding routine testing at [redacted] weeks gestation  2. Supervision of other normal pregnancy, antepartum (Primary) -taking PNV daily 19 lb 6.4 oz (8.8 kg) -reports continued occasional braxton hicks  -no concerns today   3. Anemia affecting pregnancy in third trimester -taking iron EOD -last Hgb 10.7 -repeat at RV   Preterm labor symptoms and general obstetric precautions including but not limited to vaginal bleeding, contractions, leaking of fluid and fetal movement were reviewed in detail with the patient. Please refer to After Visit Summary for other counseling recommendations.  Return in about 2 weeks (around 05/24/2023) for Routine Prenatal Care.  No future appointments.  Lenice Llamas, [place]

## 2023-05-10 NOTE — Progress Notes (Signed)
 Here today for 34.3 week MH RV. Taking PNV every day. Taking Iron every other day. Denies ED/hospital visits since last RV. Stressed importance of doing daily kick counts. Tawny Hopping, RN

## 2023-05-15 NOTE — Addendum Note (Signed)
 Addended by: Heywood Bene on: 05/15/2023 11:29 AM   Modules accepted: Orders

## 2023-05-24 ENCOUNTER — Ambulatory Visit: Admitting: Family Medicine

## 2023-05-24 ENCOUNTER — Encounter: Payer: Self-pay | Admitting: Family Medicine

## 2023-05-24 VITALS — BP 116/71 | HR 86 | Temp 97.0°F | Wt 144.2 lb

## 2023-05-24 DIAGNOSIS — Z3A36 36 weeks gestation of pregnancy: Secondary | ICD-10-CM

## 2023-05-24 DIAGNOSIS — O99013 Anemia complicating pregnancy, third trimester: Secondary | ICD-10-CM

## 2023-05-24 DIAGNOSIS — Z3483 Encounter for supervision of other normal pregnancy, third trimester: Secondary | ICD-10-CM

## 2023-05-24 DIAGNOSIS — Z3480 Encounter for supervision of other normal pregnancy, unspecified trimester: Secondary | ICD-10-CM

## 2023-05-24 LAB — HEMOGLOBIN, FINGERSTICK: Hemoglobin: 11.3 g/dL (ref 11.1–15.9)

## 2023-05-24 NOTE — Progress Notes (Signed)
  [facility] [address] [place] [address] Main phone: [phone]  Prenatal Visit  Subjective:  Kendra Chen is a 23 y.o. G2P1001 at [redacted]w[redacted]d being seen today for ongoing prenatal care.  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  Contractions: Irregular. Vag. Bleeding: None.  Movement: Present. Denies leaking of fluid/ROM.   The following portions of the patient's history were reviewed and updated as appropriate: allergies, current medications, past family history, past medical history, past social history, past surgical history and problem list. Problem list updated.  Objective:   Vitals:   05/24/23 0856 05/24/23 0900  BP: 132/77 116/71  Pulse: 79 86  Temp: (!) 97 F (36.1 C)   Weight: 144 lb 3.2 oz (65.4 kg)     Fetal Status: Fetal Heart Rate (bpm): 128 Fundal Height: 37 cm Movement: Present  Presentation: Vertex  General:  Alert, oriented and cooperative. Patient is in no acute distress.  Skin: Skin is warm and dry. No rash noted.   Cardiovascular: Normal heart rate noted  Respiratory: Normal respiratory effort, no problems with respiration noted  Abdomen: Soft, gravid, appropriate for gestational age.  Pain/Pressure: Absent     Pelvic: Cervical exam deferred        Extremities: Normal range of motion.  Edema: None  Mental Status: Normal mood and affect. Normal behavior. Normal judgment and thought content.    Assessment and Plan:  Pregnancy: G2P1001 at [redacted]w[redacted]d  1. [redacted] weeks gestation of pregnancy (Primary) -routine 36 week cultures today- self collected  2. Supervision of other normal pregnancy, antepartum -taking PNV daily  3. Anemia affecting pregnancy in third trimester -taking iron EOD -repeat Hgb today= 11.3  Preterm labor symptoms and general obstetric precautions including but not limited to vaginal bleeding, contractions, leaking of fluid and fetal movement were reviewed in detail with the patient. Please refer to After Visit Summary for other counseling recommendations.  Return in about 1 week (around 05/31/2023) for Routine Prenatal Care.  No future appointments.  Earleen Glazier, [place]

## 2023-05-24 NOTE — Progress Notes (Addendum)
 Here today for 36.3 week MH RV. Taking PNV every day taking Iron every other day. Denies ED/hospital visits since last RV. 36 week labs and info today. Self collecting swabs. Hgb check. Eden Goodpasture, RN

## 2023-05-26 LAB — CHLAMYDIA/GC NAA, CONFIRMATION
Chlamydia trachomatis, NAA: NEGATIVE
Neisseria gonorrhoeae, NAA: NEGATIVE

## 2023-05-28 LAB — CULTURE, BETA STREP (GROUP B ONLY): Strep Gp B Culture: NEGATIVE

## 2023-05-31 ENCOUNTER — Encounter: Payer: Self-pay | Admitting: Family Medicine

## 2023-05-31 ENCOUNTER — Ambulatory Visit: Admitting: Family Medicine

## 2023-05-31 VITALS — BP 119/77 | HR 83 | Temp 97.8°F | Wt 147.0 lb

## 2023-05-31 DIAGNOSIS — Z3A37 37 weeks gestation of pregnancy: Secondary | ICD-10-CM

## 2023-05-31 DIAGNOSIS — Z3480 Encounter for supervision of other normal pregnancy, unspecified trimester: Secondary | ICD-10-CM

## 2023-05-31 DIAGNOSIS — Z3483 Encounter for supervision of other normal pregnancy, third trimester: Secondary | ICD-10-CM

## 2023-05-31 DIAGNOSIS — O99013 Anemia complicating pregnancy, third trimester: Secondary | ICD-10-CM

## 2023-05-31 NOTE — Progress Notes (Signed)
  [facility] [address] [place] [address] Main phone: [phone]  Prenatal Visit  Subjective:  Kendra Chen is a 23 y.o. G2P1001 at [redacted]w[redacted]d being seen today for ongoing prenatal care.  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  Contractions: Irregular. Vag. Bleeding: None.  Movement: Present. Denies leaking of fluid/ROM.   The following portions of the patient's history were reviewed and updated as appropriate: allergies, current medications, past family history, past medical history, past social history, past surgical history and problem list. Problem list updated.  Objective:   Vitals:   05/31/23 0917  BP: 119/77  Pulse: 83  Temp: 97.8 F (36.6 C)  Weight: 147 lb (66.7 kg)    Fetal Status: Fetal Heart Rate (bpm): 135 Fundal Height: 37 cm Movement: Present  Presentation: Vertex  General:  Alert, oriented and cooperative. Patient is in no acute distress.  Skin: Skin is warm and dry. No rash noted.   Cardiovascular: Normal heart rate noted  Respiratory: Normal respiratory effort, no problems with respiration noted  Abdomen: Soft, gravid, appropriate for gestational age.  Pain/Pressure: Present     Pelvic: Cervical exam deferred        Extremities: Normal range of motion.  Edema: None  Mental Status: Normal mood and affect. Normal behavior. Normal judgment and thought content.   Assessment and Plan:  Pregnancy: G2P1001 at  [redacted]w[redacted]d  1. [redacted] weeks gestation of pregnancy (Primary) -reviewed 36 week cultures wnl -pt asked about cervical sweep- counseled that we typically do them around 39 weeks, but if she strongly desires a sweep we can do at 38 weeks  2. Supervision of other normal pregnancy, antepartum -taking PNV daily  -occasional backache with braxton hicks contractions  3. Anemia affecting pregnancy in third trimester -last Hgb 11.3 -taking iron  EOD   Term labor symptoms and general obstetric precautions including but not limited to vaginal bleeding, contractions, leaking of fluid and fetal movement were reviewed in detail with the patient. Please refer to After Visit Summary for other counseling recommendations.  Return in about 1 week (around 06/07/2023) for Routine Prenatal Care.  No future appointments.  Earleen Glazier, [place]

## 2023-05-31 NOTE — Progress Notes (Signed)
 Here today for 37.3 week MH RV. Taking PNV every day and Iron  every other day. Denies ED/hospital visits since last RV. Eden Goodpasture, RN

## 2023-06-07 ENCOUNTER — Ambulatory Visit: Admitting: Nurse Practitioner

## 2023-06-07 ENCOUNTER — Encounter: Payer: Self-pay | Admitting: Nurse Practitioner

## 2023-06-07 VITALS — BP 119/75 | HR 77 | Temp 97.1°F | Wt 150.0 lb

## 2023-06-07 DIAGNOSIS — Z3A38 38 weeks gestation of pregnancy: Secondary | ICD-10-CM

## 2023-06-07 DIAGNOSIS — Z3483 Encounter for supervision of other normal pregnancy, third trimester: Secondary | ICD-10-CM

## 2023-06-07 DIAGNOSIS — Z3480 Encounter for supervision of other normal pregnancy, unspecified trimester: Secondary | ICD-10-CM

## 2023-06-07 NOTE — Progress Notes (Signed)
 Verified has [facility] card. Correctly verbalizes how to take iron  tablet and prenatal vitamin when taken the same day. Reports takes iron  with a banana or water. Counseled to not take with water and replace with a Vitamin C beverage as aids in better absorption of the iron . Understanding verbalized. Ariel Begun, RN

## 2023-06-07 NOTE — Progress Notes (Signed)
  [facility] [address] [place] [address] Main phone: [phone]  Prenatal Visit  Subjective:  Kendra Chen is a 23 y.o. G2P1001 at [redacted]w[redacted]d being seen today for ongoing prenatal care.  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  Contractions: Not present. Vag. Bleeding: Other (scant amount of blood on glove after membrane sweep).  Movement: Present. Denies leaking of fluid/ROM.   The following portions of the patient's history were reviewed and updated as appropriate: allergies, current medications, past family history, past medical history, past social history, past surgical history and problem list. Problem list updated.  Objective:   Vitals:   06/07/23 1004  BP: 119/75  Pulse: 77  Temp: (!) 97.1 F (36.2 C)  Weight: 150 lb (68 kg)    Fetal Status: Fetal Heart Rate (bpm): 115 Fundal Height: 38 cm Movement: Present  Presentation: Vertex  General:  Alert, oriented and cooperative. Patient is in no acute distress.  Skin: Skin is warm and dry. No rash noted.   Cardiovascular: Normal heart rate noted  Respiratory: Normal respiratory effort, no problems with respiration noted  Abdomen: Soft, gravid, appropriate for gestational age.  Pain/Pressure: Absent     Pelvic: Cervical exam performed Dilation: 3.5 Effacement (%): 50 Station: Plus 1 Pt declined chaperone  Extremities: Normal range of motion.  Edema: Trace  Mental  Status: Normal mood and affect. Normal behavior. Normal judgment and thought content.   Assessment and Plan:  Pregnancy: G2P1001 at [redacted]w[redacted]d  1. Supervision of other normal pregnancy, antepartum (Primary) Taking PNV 27 lb (12.2 kg) Gained 3lbs in 1 week Pt desires membrane sweep. Had discussed 38wk membrane sweep w [facility]. [facility] and was told she could have one. Will honor that request today. -Membrane sweep discussed including pros (may induce labor w/i 48hrs) and cons (may accidentally break amniotic sac, may be uncomfortable, may not induce labor). Membrane sweep is optional and can be stopped at any time. -Pt desires membrane sweep -Membrane sweep performed - of note: pt is 3.5 cm, scant amount of bright red blood on gloves after sweep. Discussed with pt that some spotting or brown blood may be normal but with any increased amount to present to L&D. Also discussed using nipple stimulation to induce labor Discussed postpartum appt at 6 wks.  Pt has crib, carseat and hospital bag packed.  2. [redacted] weeks gestation of pregnancy    Term labor symptoms and general obstetric precautions including but not limited to vaginal bleeding, contractions, leaking of fluid and fetal movement were reviewed in detail with the patient. Please refer to After Visit Summary for other counseling recommendations.  Return in about 1 week (around 06/14/2023) for Routine PNC.  Future Appointments  Date Time Provider Department Center  06/14/2023 10:20 AM [facility] PROVIDER [facility] None    Dorna Gasman, NP

## 2023-06-08 ENCOUNTER — Telehealth: Payer: Self-pay | Admitting: Family Medicine

## 2023-06-08 NOTE — Telephone Encounter (Signed)
 Please give me a call back I been having a lot of mucus and blood coming out of me and I am consern

## 2023-06-09 ENCOUNTER — Encounter: Payer: Self-pay | Admitting: Nurse Practitioner

## 2023-06-09 ENCOUNTER — Ambulatory Visit: Admitting: Nurse Practitioner

## 2023-06-09 ENCOUNTER — Telehealth: Payer: Self-pay | Admitting: Family Medicine

## 2023-06-09 VITALS — BP 123/73 | HR 88 | Temp 97.7°F | Wt 152.2 lb

## 2023-06-09 DIAGNOSIS — Z3480 Encounter for supervision of other normal pregnancy, unspecified trimester: Secondary | ICD-10-CM

## 2023-06-09 DIAGNOSIS — Z3483 Encounter for supervision of other normal pregnancy, third trimester: Secondary | ICD-10-CM

## 2023-06-09 NOTE — Progress Notes (Signed)
  [facility] [address] [place] [address] Main phone: [phone]  Prenatal Visit  Subjective:  Kendra Chen is a 23 y.o. G2P1001 at [redacted]w[redacted]d being seen today an acute OB visit.  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  Contractions: Irregular. Vag. Bleeding: Scant.  Movement: Present. Denies leaking of fluid/ROM. She notes contractions coming once every 2 hours.  The following portions of the patient's history were reviewed and updated as appropriate: allergies, current medications, past family history, past medical history, past social history, past surgical history and problem list. Problem list updated.  Objective:   Vitals:   06/09/23 1346  BP: 123/73  Pulse: 88  Temp: 97.7 F (36.5 C)  Weight: 152 lb 3.2 oz (69 kg)    Fetal Status: Fetal Heart Rate (bpm): 140 Fundal Height: 38 cm Movement: Present     General:  Alert, oriented and cooperative. Patient is in no acute distress.  Skin: Skin is warm and dry. No rash noted.   Cardiovascular: Normal heart rate noted  Respiratory: Normal respiratory effort, no problems with respiration noted  Abdomen: Soft, gravid, appropriate for gestational age.  Pain/Pressure: Absent     Pelvic: Cervical exam deferred        Extremities: Normal range of motion.  Edema: Other (Comment) (nonpitting)  Mental Status: Normal mood and affect. Normal behavior. Normal judgment and thought content.   Assessment and Plan:   Pregnancy: G2P1001 at [redacted]w[redacted]d  1. Supervision of other normal pregnancy, antepartum (Primary) Reassured pt that pedal edema is common and normal at this stage of pregnancy esp given her BP WNL today. Rec increase water, walking, elevating feet, could wear compression socks.  Discussed that the blood-tinged mucus is most likely a result of the membrane sweep performed during Wednesday's appt. And that losing her mucus plug does not increase her risk of infection.  Term labor symptoms and general obstetric precautions including but not limited to vaginal bleeding, contractions, leaking of fluid and fetal movement were reviewed in detail with the patient.  Please refer to After Visit Summary for other counseling recommendations.  No follow-ups on file.  Future Appointments  Date Time Provider Department Center  06/14/2023 10:20 AM [facility] PROVIDER [facility] None    Dorna Gasman, NP

## 2023-06-09 NOTE — Progress Notes (Signed)
 Refer to phone noted from today.Aware of [facility] RV apt scheduled for 06/14/23 with arrival time of 1000. Correctly verbalizes how to take prenatal vitamin and iron  tablet. Taking iron  tablet with a juice. Ariel Begun, RN

## 2023-06-09 NOTE — Telephone Encounter (Signed)
 Call to client who has EGA = 38 5/7. Reports has had some edema in feet recently, but noticed increase in bilateral edema yesterday with additional swelling today. Reports has drunk 3 glasses of water today and had fast food hashbrowns, sausage, pancakes and orange juice for breakfast with a croissant for lunch. Reports intermittently elevating feet on 2 pillows and slept that way during the night.Reports early am HA with resolution. Denies vision changes of any kind. Also reports yesterday had "snot" like consistency vaginal drainage that was clearish white to bloody. States assumed was her mucus plug. States yesterday had to wear a pad as mucusy drainage continues. Not wearing a pad today, but states noticing mucusy drainage mixed with brownish to bright red blood on tissue after voiding. Per client, can come to [facility] clinic now and counseled to do so. Ariel Begun, RN

## 2023-06-09 NOTE — Telephone Encounter (Signed)
 Client evaluated in clinic today. Kendra Chen

## 2023-06-13 NOTE — Progress Notes (Unsigned)
  [facility] [address] [place] [address] Main phone: [phone]  Prenatal Visit  Subjective:  Kendra Chen is a 23 y.o. G2P1001 at [redacted]w[redacted]d being seen today for ongoing prenatal care.  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   .  .   . ***Denies leaking of fluid/ROM.   The following portions of the patient's history were reviewed and updated as appropriate: allergies, current medications, past family history, past medical history, past social history, past surgical history and problem list. Problem list updated.  Objective:  There were no vitals filed for this visit.  Fetal Status:           General:  Alert, oriented and cooperative. Patient is in no acute distress.  Skin: Skin is warm and dry. No rash noted.   Cardiovascular: Normal heart rate noted  Respiratory: Normal respiratory effort, no problems with respiration noted  Abdomen: Soft, gravid, appropriate for gestational age.        Pelvic: {Blank single:19197::"Cervical exam performed","Cervical exam deferred"}        Extremities: Normal range of motion.     Mental Status: Normal mood and affect. Normal behavior. Normal judgment and thought content.   Assessment and Plan:  Pregnancy: G2P1001 at [redacted]w[redacted]d  1. [redacted] weeks gestation of pregnancy (Primary) ***  2. Supervision of other normal pregnancy, antepartum ***  3. Anemia affecting pregnancy in third  trimester Last Hgb wnl   Term labor symptoms and general obstetric precautions including but not limited to vaginal bleeding, contractions, leaking of fluid and fetal movement were reviewed in detail with the patient. Please refer to After Visit Summary for other counseling recommendations.  No follow-ups on file.  Future Appointments  Date Time Provider Department Center  06/14/2023 10:20 AM [facility] PROVIDER [facility] None    Earleen Glazier, FNP

## 2023-06-14 ENCOUNTER — Ambulatory Visit: Admitting: Family Medicine

## 2023-06-14 ENCOUNTER — Encounter: Payer: Self-pay | Admitting: Family Medicine

## 2023-06-14 VITALS — BP 117/77 | HR 86 | Temp 97.1°F | Wt 151.8 lb

## 2023-06-14 DIAGNOSIS — Z3480 Encounter for supervision of other normal pregnancy, unspecified trimester: Secondary | ICD-10-CM

## 2023-06-14 DIAGNOSIS — O99013 Anemia complicating pregnancy, third trimester: Secondary | ICD-10-CM

## 2023-06-14 DIAGNOSIS — Z3483 Encounter for supervision of other normal pregnancy, third trimester: Secondary | ICD-10-CM

## 2023-06-14 DIAGNOSIS — Z3A39 39 weeks gestation of pregnancy: Secondary | ICD-10-CM

## 2023-06-14 NOTE — Progress Notes (Addendum)
 Here today for 39.4 week MH RV. Taking PNV every day, Iron  every other day. Denies ED/hospital visits since last RV. Needs [facility] IOL form today. Eden Goodpasture, RN  [facility] IOL form faxed to [facility] L&D. Community message sent to P. Larey Plenty, RN for IOL request date. Eden Goodpasture, RN

## 2023-06-15 ENCOUNTER — Telehealth: Payer: Self-pay

## 2023-06-15 NOTE — Telephone Encounter (Signed)
 Call to client and counseled regarding [facility] IOL appt on 06/26/23. Client counseled will receive call from [facility] between 1100 - 1400 with arrival time. Ariel Begun, RN

## 2023-06-20 DIAGNOSIS — Z3483 Encounter for supervision of other normal pregnancy, third trimester: Secondary | ICD-10-CM | POA: Diagnosis not present

## 2023-06-21 ENCOUNTER — Ambulatory Visit

## 2023-07-18 ENCOUNTER — Ambulatory Visit: Admitting: Family Medicine

## 2023-07-18 DIAGNOSIS — Z30017 Encounter for initial prescription of implantable subdermal contraceptive: Secondary | ICD-10-CM

## 2023-07-18 DIAGNOSIS — D696 Thrombocytopenia, unspecified: Secondary | ICD-10-CM | POA: Diagnosis not present

## 2023-07-18 DIAGNOSIS — O99013 Anemia complicating pregnancy, third trimester: Secondary | ICD-10-CM

## 2023-07-18 HISTORY — DX: Encounter for initial prescription of implantable subdermal contraceptive: Z30.017

## 2023-07-18 MED ORDER — ETONOGESTREL 68 MG ~~LOC~~ IMPL
68.0000 mg | DRUG_IMPLANT | Freq: Once | SUBCUTANEOUS | Status: AC
  Administered 2023-07-18: 68 mg via SUBCUTANEOUS

## 2023-07-18 NOTE — Assessment & Plan Note (Signed)
 Will recheck fingerstick hemoglobin today. Mild anemia in 10s at time of delivery. No PPH.

## 2023-07-18 NOTE — Assessment & Plan Note (Signed)
 Normal postpartum exam. Doing quite well. Discussed finding primary care clinic.

## 2023-07-18 NOTE — Patient Instructions (Addendum)
 Postpartum Care Today we checked some labs to check in on your anemia and low platelets during pregnancy.  We will call you if anything is abnormal.   Nexplanon:  Today we placed your Nexplanon implant for birth control.   After care: Women may have discomfort and some bruising following the removal of Nexplanon. Wear your pressure bandage for a full 24 hours, then an adhesive bandage for 3-5 days. Allow the wound closure strips to fall off naturally with routine showering - do not remove forcefully.   Reasons to seek care: Fever and chills Swelling and redness of the Nexplanon site Abnormal or foul drainage from the Nexplanon removal site  Contraception:  After Nexplanon is placed, use abstinence or reliable birth control for one week. You could still become pregnant for the first week.  Resource regarding exposures that could affect breast feeding: [facility] [employer] [facility] is a Dentist with lots of information on the effects of many medications and exposures on your pregnancy. It is free to use, including their web site, phone line, text service, app, or email and live chat.  http://golden-thomas.org/ Email or live chat: MotherToBaby.org Phone: 402-430-6478 Text: 210-861-9366 App: search "LactRx"   Maternal Mental Health If you start to develop the below symptoms of depression, please reach out to us  for an appointment. There is also a Biomedical scientist Health Hotline at [phone] 320-176-3688). This hotline has trained counselors, doulas, and midwifes to real-time support, information, and resources.  Feeling sad or hopeless most of the time Lack of interest in things you used to enjoy Less interest in caring for yourself (dressing, fixing hair) Trouble concentrating Trouble coping with daily tasks Constant worry about your baby Sleeping or eating too much or too little Feeling very anxious or nervous Unexplained irritability or  anger Unwanted or scary thoughts Feeling that you are not a good mother Thoughts of hurting yourself or your baby  If you feel you are experiencing a mental health crisis, please reach out to the National Suicide Prevention Hotline at 1-800-273-TALK 773-092-1797).    Therapy You can use the following website to start looking for a therapist. Remember, finding a therapist you click with it a lot like speed dating - you have to sort through a bunch of lemons before finding what you like!  Nyle Belling, LCSW Licensed Clinical Social Worker at [facility] Department  Phone: [phone] [address], [address], [place] [address]  www.[facility].com Access to care line (24/7) 215-290-6642 Treatment information  Crisis assistance  Mobile crisis team connection Mental health needs  Substance use disorder  Intellectual and developmental disabilities  https://www.psychologytoday.com/us  Search for [place], [place] (or city of your choice).  On the next page, you will see filter options at the top.  You may filter therapists by insurance they take, issues you would like to work on, or therapy types.

## 2023-07-18 NOTE — Assessment & Plan Note (Signed)
 Patient doing well, clinically stable. Will recheck CBC today for PLT. If improving, no follow up needed.

## 2023-07-18 NOTE — Progress Notes (Signed)
 Duplicate encounter. Please see other note for full.   Tempie Fee, MD 07/18/23  3:09 PM

## 2023-07-18 NOTE — Progress Notes (Signed)
 Pt here for postpartum examination and Nexplanon insertion.  Hemoglobin 12.5.  Declines STI screening.  Nexplanon placed in left arm by C. Tanis Fan, M.D. without complications.  PCP information provided.  Family planning education card given.  Condoms declined.-Chief Walkup, RN

## 2023-07-18 NOTE — Assessment & Plan Note (Signed)
 Nexplanon placed without incident. Patient tolerated well. See procedure note for more.

## 2023-07-18 NOTE — Progress Notes (Signed)
 [facility] [address] [place] [address] Main phone: [phone]  Postpartum Visit  Subjective:  Kendra Chen is a 23 y.o. [phone] female who presents for a postpartum visit. She is 4 weeks postpartum following a normal spontaneous vaginal delivery.  I have fully reviewed the prenatal and intrapartum course. Postpartum course has been uncomplicated thus far.   Patient Active Problem List   Diagnosis Date Noted   Postpartum exam 07/18/2023   Encounter for initial prescription of implantable subdermal contraceptive 07/18/2023   Gestational thrombocytopenia (HCC) 03/31/2023   Anemia affecting pregnancy 03/29/2023   Delivery The delivery was at [redacted]w[redacted]d gestational weeks.   Anesthesia: epidural.  Delivery complications: none Patient describes her labor and delivery as "really good".   Infant Baby is doing well. 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 Baby is feeding by formula.  GI & GU Bleeding: no bleeding.  Bowel function is normal.  Bladder function is normal.   Sexuality and Contraception Patient is not sexually active.  Contraception method is none.   The pregnancy intention screening data noted above was reviewed. Potential methods of contraception were discussed. The patient elected to proceed with No data recorded.  Mood Postpartum depression screening: negative. Flowsheet Row Routine Prenatal from 05/24/2023 in [facility] Department  PHQ-9 Total Score 0      Health Maintenance Health Maintenance Due  Topic Date Due   COVID-19 Vaccine (1) Never done   HPV VACCINES (1 - 3-dose series) Never done   Meningococcal B Vaccine (1 of 2 - Standard) Never done   The following portions of the patient's history were reviewed and updated as appropriate: allergies, current medications, past family history, past medical history, past surgical history, and problem list.  Review of Systems Pertinent  items are noted in HPI.  Objective:  BP 122/73 (BP Location: Right Arm, Patient Position: Sitting, Cuff Size: Normal)   Pulse 80   Ht 5\' 3"  (1.6 m)   Wt 127 lb 12.8 oz (58 kg)   Breastfeeding No   BMI 22.64 kg/m    General:  alert, cooperative, appears stated age, and no distress   Breasts:  Not indicated  Abdomen: soft, non-tender; bowel sounds normal; no masses,  no organomegaly       GU exam:  not indicated   Procedure:  Nexplanon Insertion  Patient identified, informed consent performed, consent signed.   Patient does understand that irregular bleeding is a very common side effect of this medication. She was advised to have backup contraception after placement. Patient was determined to meet WHO criteria for not being pregnant. Appropriate time out taken.  The insertion site was identified 8-10 cm (3-4 inches) from the medial epicondyle of the humerus and 3-5 cm (1.25-2 inches) posterior to (below) the sulcus (groove) between the biceps and triceps muscles of the patient's left arm and marked.  Patient was prepped with alcohol swab and then injected with 3 ml of 1% lidocaine .  Arm was prepped with chlorhexidene, Nexplanon removed from packaging,  Device confirmed in needle, then inserted full length of needle and withdrawn per handbook instructions. Nexplanon was able to palpated in the patient's arm; patient palpated the insert herself. There was minimal blood loss.  Patient insertion site covered with guaze and a pressure bandage to reduce any bruising.  The patient tolerated the procedure well and was given post procedure instructions.   Assessment:   Postpartum exam Assessment & Plan: Normal postpartum exam. Doing quite  well. Discussed finding primary care clinic.    Encounter for initial prescription of implantable subdermal contraceptive Assessment & Plan: Nexplanon placed without incident. Patient tolerated well. See procedure note for more.    Anemia affecting pregnancy  in third trimester Assessment & Plan: Will recheck fingerstick hemoglobin today. Mild anemia in 10s at time of delivery. No PPH.   Orders: -     Hemoglobin, fingerstick -     CBC  Benign gestational thrombocytopenia, antepartum [facility]) Assessment & Plan: Patient doing well, clinically stable. Will recheck CBC today for PLT. If improving, no follow up needed.    Normal postpartum exam.   Plan:   Essential components of care per ACOG recommendations:  1.  Mood and well being: Patient with negative depression screening today. Reviewed local resources for support.  - Patient tobacco use? No.   - hx of drug use? No.    2. Infant care and feeding:  -Patient currently breastmilk feeding? No.  -Social determinants of health (SDOH) reviewed in EPIC. No concerns  3. Sexuality, contraception and birth spacing - Patient does not want a pregnancy in the next year.  Desired family size is 3 children.  - Reviewed reproductive life planning. Reviewed options based on patient desire and reproductive life plan. Patient is interested in Hormonal Implant. This was provided to the patient today.   Risks, benefits, and typical effectiveness rates were reviewed.  Questions were answered.  Written information was also given to the patient to review.    The patient will follow up in  1 years for surveillance.  The patient was told to call with any further questions, or with any concerns about this method of contraception.  Emphasized use of condoms 100% of the time for STI prevention.  Emergency Contraception Precautions (ECP): Patient assessed for need of ECP. She is not a candidate based on no intercourse since delivery.   - Discussed birth spacing of 18 months  4. Sleep and fatigue -Encouraged family/partner/community support of 4 hrs of uninterrupted sleep to help with mood and fatigue  5. Physical Recovery  - Discussed patients delivery and complications. She describes her labor as good. -  Patient had a Vaginal, no problems at delivery. Patient had no lacerations. Perineal healing reviewed. Patient expressed understanding - Patient has urinary incontinence? No. - Patient is safe to resume physical and sexual activity  6.  Health Maintenance - HM due items addressed Yes - Last pap smear: Right after last delivery in 2024 - Pap smear not done at today's visit.  -Breast Cancer screening indicated? No.   7. Chronic Disease/Pregnancy Condition follow up: Anemia  - PCP follow up  Jack Marts, MD [facility] Department

## 2023-07-19 LAB — CBC
Hematocrit: 42.8 % (ref 34.0–46.6)
Hemoglobin: 13 g/dL (ref 11.1–15.9)
MCH: 25.6 pg — ABNORMAL LOW (ref 26.6–33.0)
MCHC: 30.4 g/dL — ABNORMAL LOW (ref 31.5–35.7)
MCV: 84 fL (ref 79–97)
Platelets: 194 10*3/uL (ref 150–450)
RBC: 5.07 x10E6/uL (ref 3.77–5.28)
RDW: 15.8 % — ABNORMAL HIGH (ref 11.7–15.4)
WBC: 6.3 10*3/uL (ref 3.4–10.8)

## 2023-07-19 LAB — SPECIMEN STATUS REPORT

## 2023-07-19 LAB — HEMOGLOBIN, FINGERSTICK: Hemoglobin: 12.5 g/dL (ref 11.1–15.9)

## 2023-10-03 ENCOUNTER — Encounter: Payer: Self-pay | Admitting: Family Medicine

## 2023-10-03 ENCOUNTER — Ambulatory Visit

## 2023-10-03 VITALS — BP 110/74 | HR 77 | Ht 63.0 in | Wt 131.8 lb

## 2023-10-03 DIAGNOSIS — Z975 Presence of (intrauterine) contraceptive device: Secondary | ICD-10-CM

## 2023-10-03 DIAGNOSIS — N898 Other specified noninflammatory disorders of vagina: Secondary | ICD-10-CM

## 2023-10-03 DIAGNOSIS — Z113 Encounter for screening for infections with a predominantly sexual mode of transmission: Secondary | ICD-10-CM | POA: Diagnosis not present

## 2023-10-03 LAB — WET PREP FOR TRICH, YEAST, CLUE
Clue Cell Exam: NEGATIVE
Trichomonas Exam: NEGATIVE
Yeast Exam: NEGATIVE

## 2023-10-03 MED ORDER — IBUPROFEN 600 MG PO TABS
600.0000 mg | ORAL_TABLET | Freq: Three times a day (TID) | ORAL | 0 refills | Status: AC
Start: 2023-10-03 — End: 2023-10-13

## 2023-10-03 NOTE — Progress Notes (Signed)
 Pt is here for an acute visit. Wet prep results reviewed patient and requires no treatment per provider. Condoms declined and opportunity given to patient to ask questions for any clarifications. Questions answered. Kendra Chen.

## 2023-10-03 NOTE — Progress Notes (Signed)
 [facility] Problem Visit  [facility]  Subjective:  Kendra Chen is a 23 y.o. being seen today for bleeding with Nexplanon .  Chief Complaint  Patient presents with   Acute Visit   Patient reports continuous bleeding with her Nexplanon , which was placed 3 months ago. Her bleeding is like a period, and she changes her pad around every 1.5-2 hours. This bleeding has become very bothersome for her, but she does not want device removed. Wants to know if it is worrisome and whether anything can be done to improve it.  She also endorses a green vaginal discharge for ~1 week.   Health Maintenance Due  Topic Date Due   COVID-19 Vaccine (1) Never done   HPV VACCINES (1 - 3-dose series) Never done   Meningococcal B Vaccine (1 of 2 - Standard) Never done   Hepatitis B Vaccines 19-59 Average Risk (1 of 3 - 19+ 3-dose series) Never done   INFLUENZA VACCINE  09/08/2023   The following portions of the patient's history were reviewed and updated as appropriate: allergies, current medications, past family history, past medical history, past social history, past surgical history and problem list. Problem list updated.  See flowsheet for other program required questions.  Objective:   Vitals:   10/03/23 1021  BP: 110/74  Pulse: 77  Weight: 131 lb 12.8 oz (59.8 kg)  Height: 5' 3 (1.6 m)   Physical Exam Vitals and nursing note reviewed. Exam conducted with a chaperone present Kendra Chen).  Constitutional:      Appearance: Normal appearance.  HENT:     Head: Normocephalic and atraumatic.     Mouth/Throat:     Mouth: Mucous membranes are moist.     Pharynx: Oropharynx is clear. No oropharyngeal exudate or posterior oropharyngeal erythema.  Pulmonary:     Effort: Pulmonary effort is normal.  Abdominal:     General: Abdomen is flat.     Tenderness: There is no abdominal tenderness.  Genitourinary:    General: Normal vulva.     Exam position:  Lithotomy position.     Pubic Area: No rash or pubic lice.      Labia:        Right: No rash or lesion.        Left: No rash or lesion.      Vagina: Vaginal discharge present. No erythema, bleeding or lesions.     Cervix: No cervical motion tenderness, discharge, friability, lesion or erythema.     Comments: pH = 5 Green discharge green/white, thick Skin:    General: Skin is warm and dry.     Findings: No rash.  Neurological:     Mental Status: She is alert and oriented to person, place, and time.    Assessment and Plan:  Kendra Chen is a 23 y.o. female presenting to the [facility] for a Women's Health problem visit  1. Screening for venereal disease (Primary)  - WET PREP FOR TRICH, YEAST, CLUE negative - Chlamydia/Gonorrhea [facility]  2. Vaginal discharge  - WET PREP FOR TRICH, YEAST, CLUE negative - Chlamydia/Gonorrhea [facility]  3. Breakthrough bleeding on Nexplanon   - Reports bothersome bleeding daily like a period since insertion of device. Discussed this a common adverse effect of Nexplanon , especially in 3-6 months. For many people, improves with time. - Discussed treatment options including ibuprofen  versus a month of OCPs - ibuprofen  (ADVIL ) 600 MG tablet; Take 1 tablet (600  mg total) by mouth 3 (three) times daily for 10 days.  Dispense: 30 tablet; Refill: 0  Return if symptoms worsen or fail to improve.  Future Appointments  Date Time Provider Department Center  10/03/2023 11:10 AM Macario Dorothyann HERO, MD [facility] None   Damien FORBES Satchel, NP

## 2023-12-01 ENCOUNTER — Telehealth: Admitting: Emergency Medicine

## 2023-12-01 DIAGNOSIS — S0592XA Unspecified injury of left eye and orbit, initial encounter: Secondary | ICD-10-CM

## 2023-12-01 NOTE — Progress Notes (Signed)
  Because you have an eye injury, you will need an in person eye exam, and I feel your condition warrants further evaluation and I recommend that you be seen in a face-to-face visit.   NOTE: There will be NO CHARGE for this E-Visit   If you are having a true medical emergency, please call 911.     For an urgent face to face visit, [place] has multiple urgent care centers for your convenience.  Click the link below for the full list of locations and hours, walk-in wait times, appointment scheduling options and driving directions:  Urgent Care - [place], [place], [place], [place], [place], [place]  [place]     Your MyChart E-visit questionnaire answers were reviewed by a board certified advanced clinical practitioner to complete your personal care plan based on your specific symptoms.    Thank you for using e-Visits.

## 2023-12-01 NOTE — Progress Notes (Signed)
 Message sent to patient requesting further input regarding current symptoms. Awaiting patient response.

## 2023-12-02 ENCOUNTER — Ambulatory Visit: Payer: Self-pay

## 2024-01-13 ENCOUNTER — Ambulatory Visit: Payer: Self-pay
